# Patient Record
Sex: Male | Born: 1980
Health system: Southern US, Community
[De-identification: ages and names within clinical notes are randomized; demographics above are authoritative.]

## PROBLEM LIST (undated history)

## (undated) DIAGNOSIS — I10 Essential (primary) hypertension: Secondary | ICD-10-CM

## (undated) DIAGNOSIS — J189 Pneumonia, unspecified organism: Secondary | ICD-10-CM

## (undated) DIAGNOSIS — K5792 Diverticulitis of intestine, part unspecified, without perforation or abscess without bleeding: Secondary | ICD-10-CM

## (undated) DIAGNOSIS — J302 Other seasonal allergic rhinitis: Secondary | ICD-10-CM

## (undated) DIAGNOSIS — F952 Tourette's disorder: Secondary | ICD-10-CM

## (undated) HISTORY — PX: WISDOM TOOTH EXTRACTION: SHX21

## (undated) HISTORY — PX: TONSILLECTOMY: SUR1361

## (undated) HISTORY — DX: Tourette's disorder: F95.2

---

## 2003-05-08 ENCOUNTER — Encounter: Payer: Self-pay | Admitting: Emergency Medicine

## 2003-05-08 ENCOUNTER — Emergency Department (HOSPITAL_COMMUNITY): Admission: AC | Admit: 2003-05-08 | Discharge: 2003-05-08 | Payer: Self-pay

## 2003-05-12 ENCOUNTER — Emergency Department (HOSPITAL_COMMUNITY): Admission: EM | Admit: 2003-05-12 | Discharge: 2003-05-12 | Payer: Self-pay

## 2005-05-18 ENCOUNTER — Emergency Department (HOSPITAL_COMMUNITY): Admission: EM | Admit: 2005-05-18 | Discharge: 2005-05-19 | Payer: Self-pay | Admitting: Emergency Medicine

## 2008-01-31 ENCOUNTER — Emergency Department (HOSPITAL_COMMUNITY): Admission: EM | Admit: 2008-01-31 | Discharge: 2008-01-31 | Payer: Self-pay | Admitting: Emergency Medicine

## 2009-12-26 ENCOUNTER — Emergency Department (HOSPITAL_COMMUNITY): Admission: EM | Admit: 2009-12-26 | Discharge: 2009-12-26 | Payer: Self-pay | Admitting: Emergency Medicine

## 2010-10-04 ENCOUNTER — Emergency Department (HOSPITAL_COMMUNITY): Admission: EM | Admit: 2010-10-04 | Discharge: 2010-10-04 | Payer: Self-pay | Admitting: Emergency Medicine

## 2012-01-08 ENCOUNTER — Other Ambulatory Visit: Payer: Self-pay | Admitting: Family Medicine

## 2012-01-21 ENCOUNTER — Other Ambulatory Visit: Payer: Self-pay

## 2012-01-22 ENCOUNTER — Ambulatory Visit
Admission: RE | Admit: 2012-01-22 | Discharge: 2012-01-22 | Disposition: A | Discharge status: Home / Self Care | Payer: 59 | Source: Ambulatory Visit | Attending: Family Medicine | Admitting: Family Medicine

## 2012-01-22 MED ORDER — IOHEXOL 300 MG/ML  SOLN
125.0000 mL | Freq: Once | INTRAMUSCULAR | Status: AC | PRN
  Administered 2012-01-22: 125 mL via INTRAVENOUS

## 2012-06-04 ENCOUNTER — Encounter (HOSPITAL_COMMUNITY): Payer: Self-pay | Admitting: *Deleted

## 2012-06-04 ENCOUNTER — Emergency Department (HOSPITAL_COMMUNITY)
Admission: EM | Admit: 2012-06-04 | Discharge: 2012-06-04 | Disposition: A | Discharge status: Home / Self Care | Payer: Worker's Compensation | Attending: Emergency Medicine | Admitting: Emergency Medicine

## 2012-06-04 DIAGNOSIS — Y990 Civilian activity done for income or pay: Secondary | ICD-10-CM | POA: Insufficient documentation

## 2012-06-04 DIAGNOSIS — F17200 Nicotine dependence, unspecified, uncomplicated: Secondary | ICD-10-CM | POA: Insufficient documentation

## 2012-06-04 DIAGNOSIS — Y9289 Other specified places as the place of occurrence of the external cause: Secondary | ICD-10-CM | POA: Insufficient documentation

## 2012-06-04 DIAGNOSIS — IMO0002 Reserved for concepts with insufficient information to code with codable children: Secondary | ICD-10-CM

## 2012-06-04 DIAGNOSIS — W268XXA Contact with other sharp object(s), not elsewhere classified, initial encounter: Secondary | ICD-10-CM | POA: Insufficient documentation

## 2012-06-04 DIAGNOSIS — S61209A Unspecified open wound of unspecified finger without damage to nail, initial encounter: Secondary | ICD-10-CM | POA: Insufficient documentation

## 2012-06-04 NOTE — Discharge Instructions (Signed)

## 2012-06-04 NOTE — ED Notes (Signed)
Pt presents w/ finger lacerations x 2 to anterior 3rd and 4 th digits on R hand. Bleeding controlled w/ bandage applied by RN prior to triage. Pt in no acute distress, lacerations sustained at work.

## 2012-06-04 NOTE — ED Provider Notes (Signed)
History     CSN: [id number]  Arrival date & time 06/04/12  2036   First MD Initiated Contact with Patient 06/04/12 2140      10:04 PM HPI Patient reports he was at work when his second and third right fingers were caught in a metal edge. States has small lacerations to his fingers. Reports he was advised to come to emergency department or work. Reorts last tetanus was 2 years ago. Patient is a 31 y.o. male presenting with skin laceration. The history is provided by the patient.  Laceration  The incident occurred 1 to 2 hours ago. The laceration is located on the right hand. The laceration is 1 cm in size. The laceration mechanism was a a metal edge. The pain is mild. The pain has been constant since onset. He reports no foreign bodies present. His tetanus status is UTD.    History reviewed. No pertinent past medical history.  History reviewed. No pertinent past surgical history.  History reviewed. No pertinent family history.  History  Substance Use Topics  . Smoking status: Current Everyday Smoker -- 1.0 packs/day    Types: Cigarettes  . Smokeless tobacco: Not on file  . Alcohol Use: 3.6 oz/week    6 Cans of beer per week     x 2 days a week      Review of Systems  Constitutional: Negative for fever.  Skin: Positive for wound (laceration). Negative for color change.  All other systems reviewed and are negative.    Allergies  Orap  Home Medications  No current outpatient prescriptions on file.  BP 153/90  Pulse 98  Temp 98.4 F (36.9 C) (Oral)  Resp 22  SpO2 98%  Physical Exam  Vitals reviewed. Constitutional: He is oriented to person, place, and time. He appears well-developed and well-nourished.  HENT:  Head: Normocephalic and atraumatic.  Eyes: Pupils are equal, round, and reactive to light.  Neurological: He is alert and oriented to person, place, and time.  Skin: Skin is warm and dry. No rash noted. No erythema. No pallor.       Small superficial  laceration of right third and fourth dorsal digits. Lacerations just proximal to PIP joints. Hemostasis achieved. No wound repair needed since wounds are extremely superficial  Psychiatric: He has a normal mood and affect. His behavior is normal.    ED Course  Procedures   MDM   Wounds have been cleaned with betadine and covered with bacitracin and betadine no wound closure needed. Advised patient to return for worsening signs such as erythematous streaking or failure of wound seal. Patient voices understanding and is ready for discharge    Thomasene Lot, PA-C 06/04/12 2304

## 2012-06-05 NOTE — ED Provider Notes (Signed)
Medical screening examination/treatment/procedure(s) were performed by non-physician practitioner and as supervising physician I was immediately available for consultation/collaboration.   Makai Agostinelli M Markes Shatswell, MD 06/05/12 0039 

## 2013-01-03 ENCOUNTER — Ambulatory Visit
Admission: RE | Admit: 2013-01-03 | Discharge: 2013-01-03 | Disposition: A | Discharge status: Home / Self Care | Payer: 59 | Source: Ambulatory Visit | Attending: Family Medicine | Admitting: Family Medicine

## 2013-01-03 ENCOUNTER — Other Ambulatory Visit: Payer: Self-pay | Admitting: Family Medicine

## 2013-01-03 DIAGNOSIS — M549 Dorsalgia, unspecified: Secondary | ICD-10-CM

## 2013-01-11 ENCOUNTER — Other Ambulatory Visit: Payer: Self-pay | Admitting: Gastroenterology

## 2013-01-11 DIAGNOSIS — R109 Unspecified abdominal pain: Secondary | ICD-10-CM

## 2013-01-13 ENCOUNTER — Ambulatory Visit
Admission: RE | Admit: 2013-01-13 | Discharge: 2013-01-13 | Disposition: A | Discharge status: Home / Self Care | Payer: 59 | Source: Ambulatory Visit | Attending: Gastroenterology | Admitting: Gastroenterology

## 2013-01-13 DIAGNOSIS — R109 Unspecified abdominal pain: Secondary | ICD-10-CM

## 2013-01-13 MED ORDER — IOHEXOL 300 MG/ML  SOLN
150.0000 mL | Freq: Once | INTRAMUSCULAR | Status: AC | PRN
  Administered 2013-01-13: 150 mL via INTRAVENOUS

## 2013-01-17 ENCOUNTER — Emergency Department (HOSPITAL_COMMUNITY): Payer: 59

## 2013-01-17 ENCOUNTER — Emergency Department (HOSPITAL_COMMUNITY)
Admission: EM | Admit: 2013-01-17 | Discharge: 2013-01-17 | Disposition: A | Discharge status: Home / Self Care | Payer: 59 | Attending: Emergency Medicine | Admitting: Emergency Medicine

## 2013-01-17 ENCOUNTER — Encounter (HOSPITAL_COMMUNITY): Payer: Self-pay | Admitting: *Deleted

## 2013-01-17 DIAGNOSIS — R197 Diarrhea, unspecified: Secondary | ICD-10-CM | POA: Insufficient documentation

## 2013-01-17 DIAGNOSIS — R109 Unspecified abdominal pain: Secondary | ICD-10-CM

## 2013-01-17 DIAGNOSIS — Z8719 Personal history of other diseases of the digestive system: Secondary | ICD-10-CM | POA: Insufficient documentation

## 2013-01-17 DIAGNOSIS — R110 Nausea: Secondary | ICD-10-CM | POA: Insufficient documentation

## 2013-01-17 DIAGNOSIS — F17200 Nicotine dependence, unspecified, uncomplicated: Secondary | ICD-10-CM | POA: Insufficient documentation

## 2013-01-17 HISTORY — DX: Diverticulitis of intestine, part unspecified, without perforation or abscess without bleeding: K57.92

## 2013-01-17 LAB — URINALYSIS, ROUTINE W REFLEX MICROSCOPIC
Bilirubin Urine: NEGATIVE
Glucose, UA: NEGATIVE mg/dL
Ketones, ur: 15 mg/dL — AB
Specific Gravity, Urine: 1.028 (ref 1.005–1.030)
pH: 5.5 (ref 5.0–8.0)

## 2013-01-17 LAB — COMPREHENSIVE METABOLIC PANEL
ALT: 58 U/L — ABNORMAL HIGH (ref 0–53)
AST: 41 U/L — ABNORMAL HIGH (ref 0–37)
Albumin: 3.9 g/dL (ref 3.5–5.2)
Alkaline Phosphatase: 47 U/L (ref 39–117)
Chloride: 102 mEq/L (ref 96–112)
Potassium: 4.1 mEq/L (ref 3.5–5.1)
Sodium: 141 mEq/L (ref 135–145)
Total Bilirubin: 0.2 mg/dL — ABNORMAL LOW (ref 0.3–1.2)

## 2013-01-17 LAB — CBC WITH DIFFERENTIAL/PLATELET
Basophils Absolute: 0 10*3/uL (ref 0.0–0.1)
Basophils Relative: 0 % (ref 0–1)
Hemoglobin: 17.1 g/dL — ABNORMAL HIGH (ref 13.0–17.0)
MCHC: 33.5 g/dL (ref 30.0–36.0)
Monocytes Relative: 9 % (ref 3–12)
Neutro Abs: 6.7 10*3/uL (ref 1.7–7.7)
Neutrophils Relative %: 56 % (ref 43–77)
Platelets: 323 10*3/uL (ref 150–400)
RDW: 13.1 % (ref 11.5–15.5)

## 2013-01-17 MED ORDER — HYDROMORPHONE HCL PF 1 MG/ML IJ SOLN
1.0000 mg | Freq: Once | INTRAMUSCULAR | Status: AC
Start: 2013-01-17 — End: 2013-01-17
  Administered 2013-01-17: 1 mg via INTRAVENOUS
  Filled 2013-01-17: qty 1

## 2013-01-17 MED ORDER — OXYCODONE-ACETAMINOPHEN 5-325 MG PO TABS: 1.0000 | ORAL_TABLET | Freq: Four times a day (QID) | ORAL | Status: DC | PRN

## 2013-01-17 NOTE — ED Provider Notes (Signed)
History     CSN: [id number]  Arrival date & time 01/17/13  1253   First MD Initiated Contact with Patient 01/17/13 1449      Chief Complaint  Patient presents with  . Abdominal Pain    (Consider location/radiation/quality/duration/timing/severity/associated sxs/prior treatment) Patient is a 32 y.o. male presenting with abdominal pain. The history is provided by the patient. No language interpreter was used.  Abdominal Pain The primary symptoms of the illness include abdominal pain, nausea and diarrhea. The primary symptoms of the illness do not include vomiting. The current episode started more than 2 days ago. The onset of the illness was gradual. The problem has been gradually worsening.  The severity of the abdominal pain is 3/10.  The illness is associated with recent antibiotic use. The patient has not had a change in bowel habit. Significant associated medical issues include diverticulitis.  Patient recently seen for exacerbation of diverticulitis, completed antibiotics with improvement in symptoms, confirmed by CT findings.  Patient reports awakening this morning with increase in pain, running across umbilical.region to both sides of abdomen.  Past Medical History  Diagnosis Date  . Diverticulitis     Past Surgical History  Procedure Date  . Tonsillectomy     No family history on file.  History  Substance Use Topics  . Smoking status: Current Every Day Smoker -- 0.5 packs/day    Types: Cigarettes  . Smokeless tobacco: Not on file  . Alcohol Use: No     Comment: x 2 days a week      Review of Systems  Gastrointestinal: Positive for nausea, abdominal pain and diarrhea. Negative for vomiting.  All other systems reviewed and are negative.    Allergies  Orap  Home Medications   Current Outpatient Rx  Name  Route  Sig  Dispense  Refill  . HYDROCORTISONE 1 % EX CREA   Topical   Apply 1 application topically 2 (two) times daily. For hands           BP  156/94  Pulse 94  Temp 98.3 F (36.8 C) (Oral)  Resp 16  SpO2 97%  Physical Exam  Nursing note and vitals reviewed. Constitutional: He is oriented to person, place, and time. He appears well-developed.  HENT:  Head: Normocephalic.  Eyes: Pupils are equal, round, and reactive to light.  Neck: Normal range of motion. Neck supple.  Cardiovascular: Normal rate and regular rhythm.   Pulmonary/Chest: Effort normal and breath sounds normal.  Abdominal: Soft. He exhibits distension. There is tenderness. There is no guarding and no CVA tenderness.    Musculoskeletal: Normal range of motion.  Lymphadenopathy:    He has no cervical adenopathy.  Neurological: He is alert and oriented to person, place, and time.  Skin: Skin is warm and dry.  Psychiatric: He has a normal mood and affect. His behavior is normal. Judgment and thought content normal.    ED Course  Procedures (including critical care time)  Labs Reviewed  CBC WITH DIFFERENTIAL - Abnormal; Notable for the following:    WBC 12.0 (*)     RBC 5.82 (*)     Hemoglobin 17.1 (*)     Monocytes Absolute 1.1 (*)     All other components within normal limits  COMPREHENSIVE METABOLIC PANEL - Abnormal; Notable for the following:    AST 41 (*)     ALT 58 (*)     Total Bilirubin 0.2 (*)     All other components within normal limits  URINALYSIS, ROUTINE W REFLEX MICROSCOPIC - Abnormal; Notable for the following:    Ketones, ur 15 (*)     All other components within normal limits  LIPASE, BLOOD   No results found.   No diagnosis found.  Patient has been seen by Madilyn Fireman with GI.  Patient's primary care provider has also contacted [facility] for a second opinion.  Patient is waiting for the office to contact him with the appointment time.  Discussed with Dr. Patria Mane.  Will treat pain, obtain plain films.  If no abnormal findings, home with pain medication and recheck tomorrow if pain persists.  MDM          Jimmye Norman,  NP 01/17/13 2225

## 2013-01-17 NOTE — ED Notes (Signed)
MD Campos at bedside.  

## 2013-01-17 NOTE — ED Notes (Signed)
NP David at bedside 

## 2013-01-17 NOTE — ED Notes (Signed)
PT with hx of diverticulitis to ED by Koirala (his pcp) for continued abd pain.  Pt was tx for flare-up 2 weeks prior and has completed all antibiotics.  His abd CT on 1/30 showed resolving diverticulitis.  However, when pt awoke today he began experiencing lower abd pain again.

## 2013-01-19 NOTE — ED Provider Notes (Signed)
Medical screening examination/treatment/procedure(s) were performed by non-physician practitioner and as supervising physician I was immediately available for consultation/collaboration.   Lyanne Co, MD 01/19/13 [phone]

## 2013-04-01 ENCOUNTER — Emergency Department (HOSPITAL_COMMUNITY)
Admission: EM | Admit: 2013-04-01 | Discharge: 2013-04-01 | Disposition: A | Discharge status: Home / Self Care | Payer: 59 | Attending: Emergency Medicine | Admitting: Emergency Medicine

## 2013-04-01 ENCOUNTER — Encounter (HOSPITAL_COMMUNITY): Payer: Self-pay | Admitting: Emergency Medicine

## 2013-04-01 ENCOUNTER — Emergency Department (HOSPITAL_COMMUNITY): Payer: 59

## 2013-04-01 DIAGNOSIS — K5732 Diverticulitis of large intestine without perforation or abscess without bleeding: Secondary | ICD-10-CM | POA: Insufficient documentation

## 2013-04-01 DIAGNOSIS — I10 Essential (primary) hypertension: Secondary | ICD-10-CM | POA: Insufficient documentation

## 2013-04-01 DIAGNOSIS — F17200 Nicotine dependence, unspecified, uncomplicated: Secondary | ICD-10-CM | POA: Insufficient documentation

## 2013-04-01 DIAGNOSIS — Z79899 Other long term (current) drug therapy: Secondary | ICD-10-CM | POA: Insufficient documentation

## 2013-04-01 DIAGNOSIS — K5792 Diverticulitis of intestine, part unspecified, without perforation or abscess without bleeding: Secondary | ICD-10-CM

## 2013-04-01 HISTORY — DX: Essential (primary) hypertension: I10

## 2013-04-01 LAB — CBC WITH DIFFERENTIAL/PLATELET
Basophils Absolute: 0 10*3/uL (ref 0.0–0.1)
Basophils Relative: 0 % (ref 0–1)
Eosinophils Absolute: 0.5 10*3/uL (ref 0.0–0.7)
HCT: 49.3 % (ref 39.0–52.0)
Hemoglobin: 17.8 g/dL — ABNORMAL HIGH (ref 13.0–17.0)
Lymphocytes Relative: 25 % (ref 12–46)
Lymphs Abs: 3.3 10*3/uL (ref 0.7–4.0)
MCH: 30.8 pg (ref 26.0–34.0)
MCHC: 36.1 g/dL — ABNORMAL HIGH (ref 30.0–36.0)
MCV: 85.3 fL (ref 78.0–100.0)
Monocytes Absolute: 1.2 10*3/uL — ABNORMAL HIGH (ref 0.1–1.0)
Monocytes Relative: 9 % (ref 3–12)
Neutro Abs: 8.5 10*3/uL — ABNORMAL HIGH (ref 1.7–7.7)
Neutrophils Relative %: 63 % (ref 43–77)
RBC: 5.78 MIL/uL (ref 4.22–5.81)
WBC: 13.5 10*3/uL — ABNORMAL HIGH (ref 4.0–10.5)

## 2013-04-01 LAB — COMPREHENSIVE METABOLIC PANEL
ALT: 50 U/L (ref 0–53)
Albumin: 3.8 g/dL (ref 3.5–5.2)
Alkaline Phosphatase: 48 U/L (ref 39–117)
BUN: 15 mg/dL (ref 6–23)
CO2: 30 mEq/L (ref 19–32)
Calcium: 9.7 mg/dL (ref 8.4–10.5)
Chloride: 100 mEq/L (ref 96–112)
Creatinine, Ser: 1.01 mg/dL (ref 0.50–1.35)
GFR calc Af Amer: >90 mL/min (ref >90)
GFR calc non Af Amer: >90 mL/min (ref >90)
Glucose, Bld: 95 mg/dL (ref 70–99)
Potassium: 4.6 mEq/L (ref 3.5–5.1)
Sodium: 137 mEq/L (ref 135–145)
Total Bilirubin: 0.4 mg/dL (ref 0.3–1.2)

## 2013-04-01 MED ORDER — FENTANYL CITRATE 0.05 MG/ML IJ SOLN
100.0000 ug | Freq: Once | INTRAMUSCULAR | Status: AC
  Administered 2013-04-01: 100 ug via INTRAVENOUS
  Filled 2013-04-01: qty 2

## 2013-04-01 MED ORDER — ONDANSETRON HCL 4 MG/2ML IJ SOLN
4.0000 mg | Freq: Once | INTRAMUSCULAR | Status: AC
  Administered 2013-04-01: 4 mg via INTRAVENOUS
  Filled 2013-04-01: qty 2

## 2013-04-01 MED ORDER — IOHEXOL 300 MG/ML  SOLN
100.0000 mL | Freq: Once | INTRAMUSCULAR | Status: AC | PRN
  Administered 2013-04-01: 100 mL via INTRAVENOUS

## 2013-04-01 MED ORDER — OXYCODONE-ACETAMINOPHEN 5-325 MG PO TABS: 2.0000 | ORAL_TABLET | ORAL | Status: DC | PRN

## 2013-04-01 MED ORDER — AMOXICILLIN-POT CLAVULANATE 500-125 MG PO TABS: 1.0000 | ORAL_TABLET | Freq: Three times a day (TID) | ORAL | Status: DC

## 2013-04-01 MED ORDER — SODIUM CHLORIDE 0.9 % IV SOLN
3.0000 g | Freq: Once | INTRAVENOUS | Status: AC
  Administered 2013-04-01: 3 g via INTRAVENOUS
  Filled 2013-04-01: qty 3

## 2013-04-01 MED ORDER — IOHEXOL 300 MG/ML  SOLN
25.0000 mL | INTRAMUSCULAR | Status: AC
  Administered 2013-04-01 (×2): 25 mL via ORAL

## 2013-04-01 NOTE — ED Notes (Signed)
Pt states that he is having a flare-up of his diverticulitis that started yesterday morning. The pt reports that he has a appointment with is doctor on Monday, but was advised to come to the ER today.

## 2013-04-01 NOTE — ED Notes (Signed)
Pt alert and mentating appropriately. NAD noted at this time. Pt denies pain. Pt denies n/v/d.

## 2013-04-01 NOTE — ED Provider Notes (Signed)
History     CSN: [id number]  Arrival date & time 04/01/13  7829   First MD Initiated Contact with Patient 04/01/13 1004      Chief Complaint  Patient presents with  . Diverticulitis     HPI Pt states that he is having a flare-up of his diverticulitis that started yesterday morning. The pt reports that he has a appointment with is doctor on Monday, but was advised to come to the ER today.  Past Medical History  Diagnosis Date  . Diverticulitis   . Hypertension     Past Surgical History  Procedure Laterality Date  . Tonsillectomy      History reviewed. No pertinent family history.  History  Substance Use Topics  . Smoking status: Current Every Day Smoker -- 1.00 packs/day for 15 years    Types: Cigarettes  . Smokeless tobacco: Never Used  . Alcohol Use: No     Comment: x 2 days a week      Review of Systems All other systems reviewed and are negative Allergies  Orap and Mushroom extract complex  Home Medications   Current Outpatient Rx  Name  Route  Sig  Dispense  Refill  . hydrochlorothiazide (HYDRODIURIL) 12.5 MG tablet   Oral   Take 12.5 mg by mouth daily.         . hydrocortisone cream 1 %   Topical   Apply 1 application topically 2 (two) times daily as needed (for hands).          Marland Kitchen amoxicillin-clavulanate (AUGMENTIN) 500-125 MG per tablet   Oral   Take 1 tablet (500 mg total) by mouth every 8 (eight) hours.   21 tablet   0   . oxyCODONE-acetaminophen (PERCOCET/ROXICET) 5-325 MG per tablet   Oral   Take 2 tablets by mouth every 4 (four) hours as needed for pain.   20 tablet   0     BP 142/88  Pulse 79  Temp(Src) 98.1 F (36.7 C) (Oral)  Resp 20  Ht 5\' 11"  (1.803 m)  Wt 307 lb (139.254 kg)  BMI 42.84 kg/m2  SpO2 99%  Physical Exam  Nursing note and vitals reviewed. Constitutional: He is oriented to person, place, and time. He appears well-developed and well-nourished. No distress.  HENT:  Head: Normocephalic and atraumatic.    Eyes: Pupils are equal, round, and reactive to light.  Neck: Normal range of motion.  Cardiovascular: Normal rate and intact distal pulses.   Pulmonary/Chest: No respiratory distress.  Abdominal: Normal appearance. He exhibits no distension. There is tenderness. There is no rigidity, no rebound and no guarding.    Musculoskeletal: Normal range of motion.  Neurological: He is alert and oriented to person, place, and time. No cranial nerve deficit.  Skin: Skin is warm and dry. No rash noted.  Psychiatric: He has a normal mood and affect. His behavior is normal.    ED Course  Procedures (including critical care time)  Labs Reviewed  CBC WITH DIFFERENTIAL - Abnormal; Notable for the following:    WBC 13.5 (*)    Hemoglobin 17.8 (*)    MCHC 36.1 (*)    Neutro Abs 8.5 (*)    Monocytes Absolute 1.2 (*)    All other components within normal limits  COMPREHENSIVE METABOLIC PANEL   CT abdomen pelvis with contrast  04/01/2013  *RADIOLOGY REPORT*    IMPRESSION:  1.  Sigmoid colon diverticula are noted.  There is mild stranding of the pericolonic fat proximal  sigmoid colon in the left lower quadrant.  Findings are consistent with mild diverticulitis.  No pericolonic abscess.  2.  Thickening of proximal sigmoid colon wall probable due to chronic diverticulosis.  3.  Stool noted within cecum.  No pericecal inflammation.  Normal appendix is clearly visualized.  4.  Fatty infiltration of the liver.  5.  Distended urinary bladder.    Original Report Authenticated By: Natasha Mead, M.D.      1. Diverticulitis       MDM          Nelia Shi, MD 04/01/13 [phone]

## 2013-04-01 NOTE — ED Notes (Signed)
Pt alert and mentating appropriately. Pt given d/c teaching and prescriptions. Pt verbalizes understanding and has no further questions upon d/c. Pt ambulatory leaving ED. Pt instructed not to drive and endorses wife will be driving home. NAD noted upon d/c.

## 2013-07-08 ENCOUNTER — Encounter (INDEPENDENT_AMBULATORY_CARE_PROVIDER_SITE_OTHER): Payer: Self-pay

## 2013-07-11 ENCOUNTER — Encounter (INDEPENDENT_AMBULATORY_CARE_PROVIDER_SITE_OTHER): Payer: Self-pay | Admitting: General Surgery

## 2013-07-11 ENCOUNTER — Ambulatory Visit (INDEPENDENT_AMBULATORY_CARE_PROVIDER_SITE_OTHER): Payer: 59 | Admitting: General Surgery

## 2013-07-11 VITALS — BP 182/94 | HR 84 | Temp 98.1°F | Resp 20 | Ht 71.0 in | Wt 324.0 lb

## 2013-07-11 DIAGNOSIS — Z01818 Encounter for other preprocedural examination: Secondary | ICD-10-CM

## 2013-07-11 DIAGNOSIS — K5732 Diverticulitis of large intestine without perforation or abscess without bleeding: Secondary | ICD-10-CM

## 2013-07-11 MED ORDER — METRONIDAZOLE 500 MG PO TABS: 500.0000 mg | ORAL_TABLET | ORAL | Status: DC

## 2013-07-11 NOTE — Progress Notes (Signed)
Chief Complaint  Patient presents with  . New Evaluation    eval for left colon resection    HISTORY: Christopher Williamson is a 31 y.o. male who presents to the office with abdominal pain.  He has had recurrent abd pain about every 3 months.  He has had 2 CT documented cases of mild diverticulitis without complications.   His symptoms start with abd discomfort and fatigue.  Then he develops pain in his back that radiates to his abd.  His worst pain is in his LLQ.  This had been occurring for 5 yrs.  He has tried antibiotics in the past, which help, but he has to take narcotics for several days to get through the worst of his symptoms.  Nothing he does or eats makes the symptoms worse.  His bowel habits are regular and his bowel movements are hard, but he denies straining.  His fiber intake is mostly through metamucil.  His last colonoscopy was in May and showed diverticulosis and rectosigmoid inflammation.     Past Medical History  Diagnosis Date  . Diverticulitis   . Hypertension   . Tourette's syndrome       Past Surgical History  Procedure Laterality Date  . Tonsillectomy    . Wisdom tooth extraction          Current Outpatient Prescriptions  Medication Sig Dispense Refill  . hydrochlorothiazide (HYDRODIURIL) 25 MG tablet Take 25 mg by mouth daily.      . metroNIDAZOLE (FLAGYL) 500 MG tablet Take 1 tablet (500 mg total) by mouth as directed.  3 tablet  0   No current facility-administered medications for this visit.      Allergies  Allergen Reactions  . Orap (Pimozide) Anaphylaxis  . Mushroom Extract Complex Other (See Comments)    Mushrooms cause gi upset      History reviewed. No pertinent family history.  History   Social History  . Marital Status: Married    Spouse Name: N/A    Number of Children: N/A  . Years of Education: N/A   Social History Main Topics  . Smoking status: Former Smoker -- 1.00 packs/day for 15 years    Types: Cigarettes    Quit date: 05/17/2013  .  Smokeless tobacco: Never Used  . Alcohol Use: 3.6 oz/week    6 Cans of beer per week     Comment: x 2 days a week  . Drug Use: No  . Sexually Active: Yes    Birth Control/ Protection: None   Other Topics Concern  . None   Social History Narrative  . None      REVIEW OF SYSTEMS - PERTINENT POSITIVES ONLY: Review of Systems - General ROS: negative for - chills, fever or weight loss Hematological and Lymphatic ROS: negative for - bleeding problems, blood clots or bruising Respiratory ROS: no cough, shortness of breath, or wheezing Cardiovascular ROS: no chest pain or dyspnea on exertion Gastrointestinal ROS: positive for - abdominal pain negative for - blood in stools, change in bowel habits, change in stools, constipation or nausea/vomiting Genito-Urinary ROS: no dysuria, trouble voiding, or hematuria  EXAM: Filed Vitals:   07/11/13 0907  BP: 182/94  Pulse: 84  Temp: 98.1 F (36.7 C)  Resp: 20    General appearance: alert and cooperative Resp: clear to auscultation bilaterally Cardio: regular rate and rhythm GI: normal findings: no masses palpable and soft and abnormal findings:  mild tenderness in the suprapubic region and moderate point tenderness in   the LLQ Extremities: extremities normal, atraumatic, no cyanosis or edema    ASSESSMENT AND PLAN Christopher Williamson is a 31 y.o. M with recurrent diverticulitis.  He has had at least 2 CT documented episodes and several other episodes that all responded to antibiotics.  On exam he has point tenderness in his LLQ.  He has tried dietary modifications and fiber supplements with no change in symptoms.  We discussed his options in great detail.  After presenting all of this information, I had a discussion with he and his wife about surgery.  I told them that I felt that this would most likely greatly help his symptoms, but that I could not guarantee this would resolve all of his problems, due to the fact that his pain did not seem to  match CT and lab findings.  I believe he understands this completely and has agreed to proceed with surgical resection.  I will attempt to perform this laparoscopically, if possible.    The surgery and anatomy were described to the patient as well as the risks of surgery and the possible complications.  These include: Bleeding, infection and possible wound complications such as hernia, damage to adjacent structures, leak of surgical connections, which can lead to other surgeries and possibly an ostomy (5-7%), possible need for other procedures, such as abscess drains in radiology, possible prolonged hospital stay, possible diarrhea from removal of part of the colon, possible constipation from narcotics, prolonged fatigue/weakness or appetite loss, possible complications of their medical problems such as heart disease or arrhythmias or lung problems, death (less than 1%). I believe the patient understands and wishes to proceed with the surgery.     Evonna Stoltz C Robb Sibal, MD Colon and Rectal Surgery / General Surgery [facility]      Visit Diagnoses: 1. Diverticulitis of colon without hemorrhage   2. Preop examination     Primary Care Physician: KOIRALA,DIBAS, MD  

## 2013-07-11 NOTE — Patient Instructions (Addendum)
Diverticular Disease  Diverticulosis of the colon is a common condition that afflicts about 50 percent of Americans by age 30 and nearly all by age 37. Only a small percentage of those with diverticulosis have symptoms, and even fewer will ever require surgery.    What is Diverticulosis/ Diverticulitis?  Diverticula are pockets that develop in the colon wall, usually in the sigmoid or left colon, but may involve the entire colon. Diverticulosis describes the presence of these pockets. Diverticulitis describes inflammation or complications of these pockets.  What are the symptoms of diverticular disease?  Uncomplicated diverticular disease is usually not associated with symptoms. Symptoms are related to complications of diverticular disease including diverticulits and bleeding. Diverticular disease is a common cause of significant bleeding from the colon.  Diverticulitis - an infection of the diverticula - may cause one or more of the following symptoms: pain in the abdomen, chills, fever and change in bowel habits. More intense symptoms are associated with serious complications such as perforation (rupture), abscess or fistula formation (an abnormal connection between the colon and another organ or the skin). What is the cause of diverticular disease?  The cause of diverticulosis and diverticulitis is not precisely known, but it is more common for people with a low fiber diet. It is thought that a low-fiber diet over the years creates increased colon pressure and results in pockets or diverticula.  How is diverticular disease treated?  Increasing the amount of dietary fiber (grains, legumes, vegetables, etc.) - and sometimes restricting certain foods reduces the pressure in the colon and may decrease the risk of complications due to diverticular disease.  Diverticulitis requires different management. Mild cases may be managed with oral antibiotics, dietary restrictions and possibly stool softeners.  More severe cases require hospitalization with intravenous antibiotics and dietary restraints. Most acute attacks can be relieved with such methods.  When is surgery necessary?  Surgery is reserved for patients with recurrent episodes of diverticulitis, complications or severe attacks when there's little or no response to medication. Surgery may also be required in individuals with a single episode of severe bleeding from diverticulosis or with recurrent episodes of bleeding.  Surgical treatment for diverticulitis removes the diseased part of the colon, most commonly, the left or sigmoid colon. Often the colon is hooked up or "anastomosed" again to the rectum. Complete recovery can be expected. Normal bowel function usually resumes in about three weeks. In emergency surgeries, patients may require a temporary colostomy bag. Patients are encouraged to seek medical attention for abdominal symptoms early to help avoid complications.  What is a colon and rectal surgeon? Colon and rectal surgeons are experts in the surgical and non-surgical treatment of diseases of the colon, rectum and anus. They have completed advanced surgical training in the treatment of these diseases as well as full general surgical training. Board-certified colon and rectal surgeons complete residencies in general surgery and colon and rectal surgery, and pass intensive examinations conducted by the American Board of Surgery and the American Board of Colon and Rectal Surgery. They are well-versed in the treatment of both benign and malignant diseases of the colon, rectum and anus and are able to perform routine screening examinations and surgically treat conditions if indicated to do so.  2012 American Society of Colon & Rectal Surgeons       [facility]  ONE-DAY (1) PRE-OP HOME COLON PREP INSTRUCTIONS: ** MIRALAX / GATORADE PREP / FLAGYL**  You must follow the instructions below carefully.  If you have  questions or  problems, please call and speak to someone in the clinic department at our office:   [phone].     INSTRUCTIONS: 1. Five days prior to your procedure do not eat nuts, popcorn, or fruit with seeds.  Stop all fiber supplements such as Metamucil, Citrucel, etc. 2. Two days before surgery fill the prescription at a pharmacy of your choice and purchase the additional supplies below.         MIRALAX - GATORADE -- DULCOLAX TABS -- FLEET ENEMA:   Purchase a bottle of MIRALAX  (255 gm bottle)    In addition, purchase four (4) DULCOLAX TABLETS (no prescription required- ask the pharmacist if you can't find them)   Purchase one Fleet Enema (Green and white box)    Purchase one 64 oz GATORADE.  (Do NOT purchase red Gatorade; any other flavor is acceptable) and place in refrigerator to get cold.  3.   Day Before Surgery:   6 am: Wash you abdomen with soap and repeat this on the morning of surgery and take 4 Dulcolax tablets   You may only have clear liquids (tea, coffee, juice, broth, jello, soft drinks, gummy bears).  You cannot have solid foods, cream, milk or milk products.  Drink at lease 8 ounces of liquids every hour while awake.   Take the Flagyl prescription as directed at 8 am, 2 pm and 8 pm.  It is helpful to take this with some jello instead of on an empty stomach.  Any flavor is ok, except red jello, which will cause red stools.   Mix the entire bottle of MiraLax and the Gatorade in a large container.    10:00am: Begin drinking the Gatorade mixture until gone (8 oz every 15-30 minutes).      You may suck on a lime wedge or hard candy to "freshen your palate" in between glasses   If you are a diabetic, take your blood sugar reading several time throughout the prep.  Have some juice available to take if your sugar level gets too low   You may feel chilled while taking the prep.  Have some warm tea or broth to help warm up.   Continue clear liquids until midnight or bedtime  3. The day of your  procedure:   Do not eat or drink ANYTHING after midnight before your surgery.     If you take Heart or Blood Pressure medicine, ask the pre-op nurses about these during your preop appointment.   Take a regular Fleet Enema one hour before leaving the come to the hospital.  Try to hold it in 5-10 mins before expelling.   Further pre-operative instructions will be given to you from the hospital.   Expect to be contacted 5-7 days before your surgery.

## 2013-07-18 ENCOUNTER — Encounter (HOSPITAL_COMMUNITY): Payer: Self-pay | Admitting: Pharmacy Technician

## 2013-07-25 ENCOUNTER — Encounter (HOSPITAL_COMMUNITY)
Admission: RE | Admit: 2013-07-25 | Discharge: 2013-07-25 | Disposition: A | Discharge status: Home / Self Care | Payer: 59 | Source: Ambulatory Visit | Attending: General Surgery | Admitting: General Surgery

## 2013-07-25 ENCOUNTER — Encounter (HOSPITAL_COMMUNITY): Payer: Self-pay

## 2013-07-25 DIAGNOSIS — Z01810 Encounter for preprocedural cardiovascular examination: Secondary | ICD-10-CM | POA: Insufficient documentation

## 2013-07-25 DIAGNOSIS — Z01818 Encounter for other preprocedural examination: Secondary | ICD-10-CM | POA: Insufficient documentation

## 2013-07-25 DIAGNOSIS — Z01812 Encounter for preprocedural laboratory examination: Secondary | ICD-10-CM | POA: Insufficient documentation

## 2013-07-25 HISTORY — DX: Pneumonia, unspecified organism: J18.9

## 2013-07-25 HISTORY — DX: Other seasonal allergic rhinitis: J30.2

## 2013-07-25 LAB — CBC
MCH: 29.5 pg (ref 26.0–34.0)
Platelets: 319 10*3/uL (ref 150–400)
RBC: 5.32 MIL/uL (ref 4.22–5.81)
RDW: 13.1 % (ref 11.5–15.5)

## 2013-07-25 LAB — BASIC METABOLIC PANEL
CO2: 31 mEq/L (ref 19–32)
Calcium: 10 mg/dL (ref 8.4–10.5)
GFR calc Af Amer: >90 mL/min (ref >90)
GFR calc non Af Amer: >90 mL/min (ref >90)
Sodium: 137 mEq/L (ref 135–145)

## 2013-07-25 NOTE — Progress Notes (Signed)
07/25/13 1355  OBSTRUCTIVE SLEEP APNEA  Have you ever been diagnosed with sleep apnea through a sleep study? No  Do you snore loudly (loud enough to be heard through closed doors)?  1  Do you often feel tired, fatigued, or sleepy during the daytime? 0  Has anyone observed you stop breathing during your sleep? 0  Do you have, or are you being treated for high blood pressure? 1  BMI more than 35 kg/m2? 1  Age over 30 years old? 0  Neck circumference greater than 40 cm/18 inches? 0  Gender: 1  Obstructive Sleep Apnea Score 4  Score 4 or greater  Results sent to PCP

## 2013-07-25 NOTE — Patient Instructions (Addendum)
[address] Loden Laurent  07/25/2013   Your procedure is scheduled on: 08/01/13  Report to [facility] at 7:00 AM.  Call this number if you have problems the morning of surgery [phone]: [phone]   Remember: please follow bowel prep instructions    Do not eat food or drink liquids After Midnight.   Do not wear jewelry, make-up or nail polish.  Do not wear lotions, powders, or perfumes. You may wear deodorant.  Do not shave 48 hours prior to surgery. Men may shave face and neck.  Do not bring valuables to the hospital.  Contacts, dentures or bridgework may not be worn into surgery.  Leave suitcase in the car. After surgery it may be brought to your room.  For patients admitted to the hospital, checkout time is 11:00 AM the day of discharge.    Please read over the following fact sheets that you were given: blood fact sheet Birdie Sons, RN  pre op nurse call if needed [phone]    FAILURE TO FOLLOW THESE INSTRUCTIONS MAY RESULT IN CANCELLATION OF YOUR SURGERY   Patient Signature: ___________________________________________

## 2013-07-25 NOTE — Progress Notes (Signed)
CT abdomen 04/01/13 on EPIC

## 2013-07-31 NOTE — Anesthesia Preprocedure Evaluation (Addendum)
Anesthesia Evaluation  Patient identified by MRN, date of birth, ID band Patient awake    Reviewed: Allergy & Precautions, H&P , NPO status , Patient's Chart, lab work & pertinent test results  Airway Mallampati: II TM Distance: >3 FB Neck ROM: full    Dental no notable dental hx. (+) Teeth Intact and Dental Advisory Given   Pulmonary neg pulmonary ROS,  breath sounds clear to auscultation  Pulmonary exam normal       Cardiovascular Exercise Tolerance: Good hypertension, Pt. on medications Rhythm:regular Rate:Normal     Neuro/Psych Tourette'snegative neurological ROS  negative psych ROS   GI/Hepatic negative GI ROS, Neg liver ROS,   Endo/Other  Morbid obesity  Renal/GU negative Renal ROS  negative genitourinary   Musculoskeletal   Abdominal (+) + obese,   Peds  Hematology negative hematology ROS (+)   Anesthesia Other Findings   Reproductive/Obstetrics negative OB ROS                         Anesthesia Physical Anesthesia Plan  ASA: III  Anesthesia Plan: General   Post-op Pain Management:    Induction: Intravenous  Airway Management Planned: Oral ETT  Additional Equipment:   Intra-op Plan:   Post-operative Plan: Extubation in OR  Informed Consent: I have reviewed the patients History and Physical, chart, labs and discussed the procedure including the risks, benefits and alternatives for the proposed anesthesia with the patient or authorized representative who has indicated his/her understanding and acceptance.   Dental Advisory Given  Plan Discussed with: CRNA and Surgeon  Anesthesia Plan Comments:        Anesthesia Quick Evaluation

## 2013-08-01 ENCOUNTER — Encounter (HOSPITAL_COMMUNITY): Payer: Self-pay | Admitting: *Deleted

## 2013-08-01 ENCOUNTER — Inpatient Hospital Stay (HOSPITAL_COMMUNITY)
Admission: RE | Admit: 2013-08-01 | Discharge: 2013-08-04 | DRG: 330 | Disposition: A | Discharge status: Home / Self Care | Payer: 59 | Source: Ambulatory Visit | Attending: General Surgery | Admitting: General Surgery

## 2013-08-01 ENCOUNTER — Ambulatory Visit (HOSPITAL_COMMUNITY): Payer: 59 | Admitting: Anesthesiology

## 2013-08-01 ENCOUNTER — Encounter (HOSPITAL_COMMUNITY)
Admission: RE | Disposition: A | Discharge status: Home / Self Care | Payer: Self-pay | Source: Ambulatory Visit | Attending: General Surgery

## 2013-08-01 ENCOUNTER — Encounter (HOSPITAL_COMMUNITY): Payer: Self-pay | Admitting: Anesthesiology

## 2013-08-01 DIAGNOSIS — K5732 Diverticulitis of large intestine without perforation or abscess without bleeding: Principal | ICD-10-CM | POA: Diagnosis present

## 2013-08-01 DIAGNOSIS — Z6841 Body Mass Index (BMI) 40.0 and over, adult: Secondary | ICD-10-CM

## 2013-08-01 DIAGNOSIS — I10 Essential (primary) hypertension: Secondary | ICD-10-CM | POA: Diagnosis present

## 2013-08-01 DIAGNOSIS — Z01812 Encounter for preprocedural laboratory examination: Secondary | ICD-10-CM

## 2013-08-01 HISTORY — PX: LAPAROSCOPIC PARTIAL COLECTOMY: SHX5907

## 2013-08-01 LAB — TYPE AND SCREEN
ABO/RH(D): A POS
Antibody Screen: NEGATIVE

## 2013-08-01 SURGERY — LAPAROSCOPIC PARTIAL COLECTOMY
Anesthesia: General | Site: Abdomen | Wound class: Clean Contaminated

## 2013-08-01 MED ORDER — DEXTROSE 5 % IV SOLN
3.0000 g | INTRAVENOUS | Status: AC
  Administered 2013-08-01: 3 g via INTRAVENOUS
  Filled 2013-08-01: qty 3000

## 2013-08-01 MED ORDER — CISATRACURIUM BESYLATE (PF) 10 MG/5ML IV SOLN
INTRAVENOUS | Status: DC | PRN
  Administered 2013-08-01: 10 mg via INTRAVENOUS
  Administered 2013-08-01: 4 mg via INTRAVENOUS
  Administered 2013-08-01: 2 mg via INTRAVENOUS
  Administered 2013-08-01: 4 mg via INTRAVENOUS
  Administered 2013-08-01: 2 mg via INTRAVENOUS
  Administered 2013-08-01: 6 mg via INTRAVENOUS

## 2013-08-01 MED ORDER — LACTATED RINGERS IV SOLN
INTRAVENOUS | Status: DC
Start: 2013-08-01 — End: 2013-08-01
  Administered 2013-08-01: 15:00:00 via INTRAVENOUS

## 2013-08-01 MED ORDER — CEFAZOLIN SODIUM 1-5 GM-% IV SOLN
INTRAVENOUS | Status: AC
  Filled 2013-08-01: qty 50

## 2013-08-01 MED ORDER — ALVIMOPAN 12 MG PO CAPS
12.0000 mg | ORAL_CAPSULE | Freq: Two times a day (BID) | ORAL | Status: DC
  Administered 2013-08-02 – 2013-08-04 (×5): 12 mg via ORAL
  Filled 2013-08-01 (×6): qty 1

## 2013-08-01 MED ORDER — CEFAZOLIN SODIUM-DEXTROSE 2-3 GM-% IV SOLR
INTRAVENOUS | Status: AC
  Filled 2013-08-01: qty 50

## 2013-08-01 MED ORDER — FENTANYL CITRATE 0.05 MG/ML IJ SOLN
INTRAMUSCULAR | Status: DC | PRN
  Administered 2013-08-01: 50 ug via INTRAVENOUS
  Administered 2013-08-01 (×4): 100 ug via INTRAVENOUS
  Administered 2013-08-01: 50 ug via INTRAVENOUS

## 2013-08-01 MED ORDER — HEPARIN SODIUM (PORCINE) 5000 UNIT/ML IJ SOLN
5000.0000 [IU] | Freq: Once | INTRAMUSCULAR | Status: AC
  Administered 2013-08-01: 5000 [IU] via SUBCUTANEOUS
  Filled 2013-08-01: qty 1

## 2013-08-01 MED ORDER — NEOSTIGMINE METHYLSULFATE 1 MG/ML IJ SOLN
INTRAMUSCULAR | Status: DC | PRN
  Administered 2013-08-01: 2 mg via INTRAVENOUS

## 2013-08-01 MED ORDER — ONDANSETRON HCL 4 MG/2ML IJ SOLN
INTRAMUSCULAR | Status: DC | PRN
  Administered 2013-08-01 (×2): 2 mg via INTRAVENOUS

## 2013-08-01 MED ORDER — MORPHINE SULFATE (PF) 1 MG/ML IV SOLN
INTRAVENOUS | Status: AC
  Filled 2013-08-01: qty 25

## 2013-08-01 MED ORDER — DIPHENHYDRAMINE HCL 12.5 MG/5ML PO ELIX: 12.5000 mg | ORAL_SOLUTION | Freq: Four times a day (QID) | ORAL | Status: DC | PRN

## 2013-08-01 MED ORDER — ALVIMOPAN 12 MG PO CAPS
12.0000 mg | ORAL_CAPSULE | Freq: Once | ORAL | Status: AC
  Administered 2013-08-01: 12 mg via ORAL
  Filled 2013-08-01: qty 1

## 2013-08-01 MED ORDER — LACTATED RINGERS IV BOLUS (SEPSIS)
500.0000 mL | Freq: Once | INTRAVENOUS | Status: AC
  Administered 2013-08-01: 500 mL via INTRAVENOUS

## 2013-08-01 MED ORDER — SUCCINYLCHOLINE CHLORIDE 20 MG/ML IJ SOLN
INTRAMUSCULAR | Status: DC | PRN
  Administered 2013-08-01: 140 mg via INTRAVENOUS

## 2013-08-01 MED ORDER — LACTATED RINGERS IV SOLN
INTRAVENOUS | Status: DC
  Administered 2013-08-02: 01:00:00 via INTRAVENOUS

## 2013-08-01 MED ORDER — NALOXONE HCL 0.4 MG/ML IJ SOLN: 0.4000 mg | INTRAMUSCULAR | Status: DC | PRN

## 2013-08-01 MED ORDER — ONDANSETRON HCL 4 MG/2ML IJ SOLN
4.0000 mg | Freq: Four times a day (QID) | INTRAMUSCULAR | Status: DC | PRN
  Administered 2013-08-01: 4 mg via INTRAVENOUS
  Filled 2013-08-01: qty 2

## 2013-08-01 MED ORDER — DEXAMETHASONE SODIUM PHOSPHATE 10 MG/ML IJ SOLN
INTRAMUSCULAR | Status: DC | PRN
  Administered 2013-08-01: 10 mg via INTRAVENOUS

## 2013-08-01 MED ORDER — ENOXAPARIN SODIUM 40 MG/0.4ML ~~LOC~~ SOLN
40.0000 mg | SUBCUTANEOUS | Status: DC
  Administered 2013-08-02 – 2013-08-03 (×2): 40 mg via SUBCUTANEOUS
  Filled 2013-08-01 (×3): qty 0.4

## 2013-08-01 MED ORDER — MORPHINE SULFATE (PF) 1 MG/ML IV SOLN
INTRAVENOUS | Status: DC
  Administered 2013-08-01: 14:00:00 via INTRAVENOUS
  Administered 2013-08-01 – 2013-08-02 (×2): 10.5 mg via INTRAVENOUS
  Administered 2013-08-02: 10:00:00 via INTRAVENOUS
  Administered 2013-08-02: 1.5 mg via INTRAVENOUS
  Administered 2013-08-02: 13.5 mg via INTRAVENOUS
  Administered 2013-08-02: 9 mg via INTRAVENOUS
  Administered 2013-08-02: 25 mg via INTRAVENOUS
  Administered 2013-08-02: 7.5 mg via INTRAVENOUS
  Administered 2013-08-02: 11.73 mg via INTRAVENOUS
  Administered 2013-08-02: 3 mg via INTRAVENOUS
  Administered 2013-08-03: 22.25 mg via INTRAVENOUS
  Administered 2013-08-03: 12 mg via INTRAVENOUS
  Filled 2013-08-01 (×4): qty 25

## 2013-08-01 MED ORDER — LABETALOL HCL 5 MG/ML IV SOLN
INTRAVENOUS | Status: DC | PRN
  Administered 2013-08-01: 5 mg via INTRAVENOUS

## 2013-08-01 MED ORDER — LIDOCAINE HCL (CARDIAC) 20 MG/ML IV SOLN
INTRAVENOUS | Status: DC | PRN
  Administered 2013-08-01: 100 mg via INTRAVENOUS

## 2013-08-01 MED ORDER — PROPOFOL 10 MG/ML IV BOLUS
INTRAVENOUS | Status: DC | PRN
  Administered 2013-08-01: 210 mg via INTRAVENOUS

## 2013-08-01 MED ORDER — LACTATED RINGERS IV SOLN
INTRAVENOUS | Status: DC | PRN
  Administered 2013-08-01 (×3): via INTRAVENOUS

## 2013-08-01 MED ORDER — HYDRALAZINE HCL 20 MG/ML IJ SOLN
INTRAMUSCULAR | Status: DC | PRN
  Administered 2013-08-01 (×4): 5 mg via INTRAVENOUS

## 2013-08-01 MED ORDER — HYDROMORPHONE HCL PF 1 MG/ML IJ SOLN
INTRAMUSCULAR | Status: DC | PRN
  Administered 2013-08-01: 2 mg via INTRAVENOUS

## 2013-08-01 MED ORDER — BUPIVACAINE-EPINEPHRINE PF 0.25-1:200000 % IJ SOLN
INTRAMUSCULAR | Status: DC | PRN
  Administered 2013-08-01: 44 mL

## 2013-08-01 MED ORDER — METRONIDAZOLE IN NACL 5-0.79 MG/ML-% IV SOLN
INTRAVENOUS | Status: AC
  Filled 2013-08-01: qty 100

## 2013-08-01 MED ORDER — BUPIVACAINE-EPINEPHRINE 0.25% -1:200000 IJ SOLN
INTRAMUSCULAR | Status: AC
  Filled 2013-08-01: qty 1

## 2013-08-01 MED ORDER — HYDROMORPHONE HCL PF 1 MG/ML IJ SOLN
0.2500 mg | INTRAMUSCULAR | Status: DC | PRN
  Administered 2013-08-01 (×2): 0.5 mg via INTRAVENOUS

## 2013-08-01 MED ORDER — HYDROMORPHONE HCL PF 1 MG/ML IJ SOLN
INTRAMUSCULAR | Status: AC
  Filled 2013-08-01: qty 1

## 2013-08-01 MED ORDER — LACTATED RINGERS IR SOLN
Status: DC | PRN
  Administered 2013-08-01: 1000 mL

## 2013-08-01 MED ORDER — CEFAZOLIN SODIUM-DEXTROSE 2-3 GM-% IV SOLR
2.0000 g | Freq: Three times a day (TID) | INTRAVENOUS | Status: AC
  Administered 2013-08-01: 2 g via INTRAVENOUS
  Filled 2013-08-01: qty 50

## 2013-08-01 MED ORDER — GLYCOPYRROLATE 0.2 MG/ML IJ SOLN
INTRAMUSCULAR | Status: DC | PRN
  Administered 2013-08-01: 0.4 mg via INTRAVENOUS

## 2013-08-01 MED ORDER — METRONIDAZOLE IN NACL 5-0.79 MG/ML-% IV SOLN
500.0000 mg | Freq: Three times a day (TID) | INTRAVENOUS | Status: AC
  Administered 2013-08-01: 500 mg via INTRAVENOUS
  Filled 2013-08-01: qty 100

## 2013-08-01 MED ORDER — HYDROCHLOROTHIAZIDE 25 MG PO TABS
25.0000 mg | ORAL_TABLET | Freq: Every morning | ORAL | Status: DC
Start: 2013-08-02 — End: 2013-08-04
  Administered 2013-08-02 – 2013-08-04 (×3): 25 mg via ORAL
  Filled 2013-08-01 (×3): qty 1

## 2013-08-01 MED ORDER — DIPHENHYDRAMINE HCL 50 MG/ML IJ SOLN: 12.5000 mg | Freq: Four times a day (QID) | INTRAMUSCULAR | Status: DC | PRN

## 2013-08-01 MED ORDER — SODIUM CHLORIDE 0.9 % IJ SOLN: 9.0000 mL | INTRAMUSCULAR | Status: DC | PRN

## 2013-08-01 MED ORDER — MIDAZOLAM HCL 5 MG/5ML IJ SOLN
INTRAMUSCULAR | Status: DC | PRN
  Administered 2013-08-01 (×2): 1 mg via INTRAVENOUS

## 2013-08-01 MED ORDER — METRONIDAZOLE IN NACL 5-0.79 MG/ML-% IV SOLN
500.0000 mg | INTRAVENOUS | Status: AC
  Administered 2013-08-01: 500 g via INTRAVENOUS
  Filled 2013-08-01: qty 100

## 2013-08-01 SURGICAL SUPPLY — 75 items
APPLIER CLIP 5 13 M/L LIGAMAX5 (MISCELLANEOUS)
BLADE EXTENDED COATED 6.5IN (ELECTRODE) ×2 IMPLANT
BLADE HEX COATED 2.75 (ELECTRODE) ×6 IMPLANT
BLADE SURG SZ10 CARB STEEL (BLADE) ×2 IMPLANT
CABLE HIGH FREQUENCY MONO STRZ (ELECTRODE) ×2 IMPLANT
CANISTER SUCTION 2500CC (MISCELLANEOUS) ×4 IMPLANT
CELLS DAT CNTRL 66122 CELL SVR (MISCELLANEOUS) IMPLANT
CHLORAPREP W/TINT 26ML (MISCELLANEOUS) ×2 IMPLANT
CLIP APPLIE 5 13 M/L LIGAMAX5 (MISCELLANEOUS) IMPLANT
CLOTH BEACON ORANGE TIMEOUT ST (SAFETY) ×2 IMPLANT
COUNTER NEEDLE 20 DBL MAG RED (NEEDLE) ×2 IMPLANT
COVER MAYO STAND STRL (DRAPES) ×4 IMPLANT
DECANTER SPIKE VIAL GLASS SM (MISCELLANEOUS) ×2 IMPLANT
DRAIN CHANNEL 19F RND (DRAIN) IMPLANT
DRAPE LAPAROSCOPIC ABDOMINAL (DRAPES) ×2 IMPLANT
DRAPE LG THREE QUARTER DISP (DRAPES) ×2 IMPLANT
DRAPE UTILITY XL STRL (DRAPES) ×6 IMPLANT
DRAPE WARM FLUID 44X44 (DRAPE) ×2 IMPLANT
DRSG OPSITE POSTOP 4X6 (GAUZE/BANDAGES/DRESSINGS) ×2 IMPLANT
ELECT CAUTERY BLADE 6.4 (BLADE) ×2 IMPLANT
ELECT REM PT RETURN 9FT ADLT (ELECTROSURGICAL) ×2
ELECTRODE REM PT RTRN 9FT ADLT (ELECTROSURGICAL) ×1 IMPLANT
EVACUATOR SILICONE 100CC (DRAIN) IMPLANT
GLOVE BIO SURGEON STRL SZ 6.5 (GLOVE) ×8 IMPLANT
GLOVE BIOGEL PI IND STRL 7.0 (GLOVE) ×4 IMPLANT
GLOVE BIOGEL PI INDICATOR 7.0 (GLOVE) ×4
GOWN STRL REIN 2XL XLG LVL4 (GOWN DISPOSABLE) ×4 IMPLANT
GOWN STRL REIN XL XLG (GOWN DISPOSABLE) ×14 IMPLANT
KIT BASIN OR (CUSTOM PROCEDURE TRAY) ×2 IMPLANT
LEGGING LITHOTOMY PAIR STRL (DRAPES) ×2 IMPLANT
LIGASURE IMPACT 36 18CM CVD LR (INSTRUMENTS) IMPLANT
LUBRICANT JELLY K Y 4OZ (MISCELLANEOUS) ×2 IMPLANT
NS IRRIG 1000ML POUR BTL (IV SOLUTION) ×2 IMPLANT
PENCIL BUTTON HOLSTER BLD 10FT (ELECTRODE) ×6 IMPLANT
RTRCTR WOUND ALEXIS 18CM MED (MISCELLANEOUS)
SCISSORS LAP 5X35 DISP (ENDOMECHANICALS) ×2 IMPLANT
SEALER TISSUE G2 CVD JAW 35 (ENDOMECHANICALS) ×1 IMPLANT
SEALER TISSUE G2 CVD JAW 45CM (ENDOMECHANICALS) ×1
SET IRRIG TUBING LAPAROSCOPIC (IRRIGATION / IRRIGATOR) ×2 IMPLANT
SLEEVE XCEL OPT CAN 5 100 (ENDOMECHANICALS) ×2 IMPLANT
SOLUTION ANTI FOG 6CC (MISCELLANEOUS) ×2 IMPLANT
SPONGE GAUZE 4X4 12PLY (GAUZE/BANDAGES/DRESSINGS) ×2 IMPLANT
SPONGE LAP 18X18 X RAY DECT (DISPOSABLE) ×6 IMPLANT
STAPLER CIRC CVD 29MM 37CM (STAPLE) ×2 IMPLANT
STAPLER CUT CVD 40MM BLUE (STAPLE) ×2 IMPLANT
STAPLER CUT RELOAD BLUE (STAPLE) ×2 IMPLANT
STAPLER VISISTAT 35W (STAPLE) ×2 IMPLANT
SUCTION POOLE TIP (SUCTIONS) ×2 IMPLANT
SUT ETHILON 2 0 PS N (SUTURE) IMPLANT
SUT NOVA 1 T20/GS 25DT (SUTURE) ×4 IMPLANT
SUT PDS AB 1 CTX 36 (SUTURE) IMPLANT
SUT PDS AB 1 TP1 96 (SUTURE) ×4 IMPLANT
SUT PROLENE 2 0 KS (SUTURE) ×2 IMPLANT
SUT SILK 2 0 (SUTURE) ×1
SUT SILK 2 0 SH CR/8 (SUTURE) ×4 IMPLANT
SUT SILK 2-0 18XBRD TIE 12 (SUTURE) ×1 IMPLANT
SUT SILK 3 0 (SUTURE) ×1
SUT SILK 3 0 SH CR/8 (SUTURE) ×2 IMPLANT
SUT SILK 3-0 18XBRD TIE 12 (SUTURE) ×1 IMPLANT
SUT VIC AB 2-0 SH 18 (SUTURE) ×2 IMPLANT
SUT VIC AB 4-0 PS2 18 (SUTURE) ×2 IMPLANT
SUT VICRYL 2 0 18  UND BR (SUTURE)
SUT VICRYL 2 0 18 UND BR (SUTURE) IMPLANT
SYR BULB IRRIGATION 50ML (SYRINGE) ×2 IMPLANT
SYS LAPSCP GELPORT 120MM (MISCELLANEOUS) ×2
SYSTEM LAPSCP GELPORT 120MM (MISCELLANEOUS) ×1 IMPLANT
TAPE CLOTH 4X10 WHT NS (GAUZE/BANDAGES/DRESSINGS) ×2 IMPLANT
TOWEL OR 17X26 10 PK STRL BLUE (TOWEL DISPOSABLE) ×4 IMPLANT
TOWEL OR NON WOVEN STRL DISP B (DISPOSABLE) ×4 IMPLANT
TRAY FOLEY CATH 14FRSI W/METER (CATHETERS) ×2 IMPLANT
TRAY LAP CHOLE (CUSTOM PROCEDURE TRAY) ×2 IMPLANT
TROCAR BLADELESS OPT 5 100 (ENDOMECHANICALS) ×4 IMPLANT
TROCAR XCEL BLUNT TIP 100MML (ENDOMECHANICALS) ×2 IMPLANT
TUBING FILTER THERMOFLATOR (ELECTROSURGICAL) ×4 IMPLANT
YANKAUER SUCT BULB TIP 10FT TU (MISCELLANEOUS) ×4 IMPLANT

## 2013-08-01 NOTE — Op Note (Signed)
08/01/2013  1:36 PM  PATIENT:  Christopher Williamson  32 y.o. male  Patient Care Team: Darrow Bussing, MD as PCP - General (Family Medicine)  PRE-OPERATIVE DIAGNOSIS:  recurrent diverticulititis  POST-OPERATIVE DIAGNOSIS:  recurrent diverticulititis  PROCEDURE:LAPAROSCOPIC sigmoidectomy with splenic flexure moblization.  SURGEON:  Surgeon(s): Romie Levee, MD  ASSISTANT: [place]   ANESTHESIA:   general  EBL: 50ml  Total I/O In: 2700 [I.V.:2700] Out: 275 [Urine:225; Blood:50]  Delay start of Pharmacological VTE agent (>24hrs) due to surgical blood loss or risk of bleeding:  yes  DRAINS: none   SPECIMEN:  Source of Specimen:  sigmoid  DISPOSITION OF SPECIMEN:  PATHOLOGY  COUNTS:  YES  PLAN OF CARE: Admit to inpatient   PATIENT DISPOSITION:  PACU - hemodynamically stable.  INDICATION: this is a 32 year old male who has an approximately five-year history of recurrent left lower quadrant pain. He has had at least 2 CT documented cases of diverticulitis. He has had several more that have responded to antibiotics well. Given his age and the cyclical nature of his disease I have recommended a sigmoidectomy. The risk and benefits of the procedure were explained to the patient prior to the OR and consent was signed and placed on chart.  OR FINDINGS: sigmoid colon with chronic inflammation, minimal diverticuli  DESCRIPTION: The patient was identified in the preoperative holding area and taken to the OR where they were laid supine on the operating room table. General anesthesia was smoothly induced.  The patient was then placed in lithotomy position and a Foley catheter was inserted under sterile conditions.  The patient was then prepped and draped in the usual sterile fashion. A surgical timeout was performed indicating the correct patient, procedure, positioning and preoperative antibiotics. SCDs were noted to be in place and functioning prior to the operation.  I began by making a  supraumbilical incision using a scalpel. Blunt dissection was carried down to the level of the fascia. The fascia was then elevated using 2 Kocher clamps. The fascia was incised midline. The peritoneum was entered bluntly. A pursestring suture was placed into the fascia using an 0 Vicryl suture. The [name] was then placed into the abdomen and insufflated to approximately 15 mm of mercury. The abdomen was evaluated. There were no injuries upon entry. The abdomen appeared normal. The small bowel was normal in caliber. The liver was healthy appearing. The large bowel appeared normal except for some adhesions noted in the left lower quadrant abdominal wall. These were taken down using combination of sharp and blunt dissection. I placed 25 mm ports under direct visualization. One was in the right upper quadrant one in the right lower quadrant. Another 5 mm port was placed in the left upper quadrant for help with retraction of the splenic flexure mobilization.  There was a portion of small bowel that was adherent to this portion of disease. This is taken down using mostly blunt dissection. The remaining colon was then mobilized off of the lateral sidewall down the white line of Toldt to the level of the splenic flexure. At this point a Pfannenstiel incision was made approximately 2 fingerbreadths above the pubic symphysis. An Alexis wound protector was placed and the GelPort was placed over this. I continued with blunt dissection using a hand-assisted laparoscopic technique. The colon was freed up from the left lower quadrant. The ureter was left in place. I was unable to mobilize the sigmoid out of the abdomen and therefore I felt like I would not be able to  have a good anastomosis without tension. I decided to take down the splenic flexure. This was done using an Enseal device for hemostasis.  The entire splenic flexure was mobilized. Once this was completed we had good length down into the pelvis. I removed the GelPort  and mobilized the colon out of the incision. I identified a portion proximal to the area of inflammation and transected this with a blue load contour stapler. I then divided the mesentery high close to the colon to avoid injury to the retroperitoneal structures. This was done using the Enseal device. I mobilized down to the top of the rectum through the mesentery. After this was completed the sigmoid was transected at the rectosigmoid junction using a blue load contour device. This freed our specimen which was sent to pathology for further examination. The remaining colon easily mobilized down to the pelvis at this time.  I evaluated the proximal colon. A 29 mm EEA stapler was selected. A pursestring device was brought onto the field and a 2-0 Prolene pursestring was placed along the proximal colon. This was tied over a 29 mm EEA blue load anvil.  This was then dropped back into the abdomen. EEA stapler was then introduced into the patient's rectum. It was brought out through the proximal staple line under direct visualization. The anastomosis was created without tension. The rectum was insufflated using a proctoscope. There was no airleak noted.  The pelvis was irrigated with normal saline. Hemostasis was good. We then switched to a clean instruments and clean drapes. Our gowns and gloves were switched as well. The peritoneum was then closed using a running 2-0 Vicryl suture. The fascia was closed using 2, 0 looped PDS sutures. The subcutaneous layer was closed using interrupted 2-0 Vicryl sutures. The skin was then closed with a running 4-0 Vicryl subcuticular suture. The abdomen was then reinsufflated. The wound looked good from the inside. Hemostasis also appeared to be stable. The omentum was then brought over the small bowel. The 5 mm ports were removed under direct visualization.  The abdomen was  Desufflated. The pursestring suture around the umbilical [name] was closed. 2-0 Vicryl sutures were used to close  the subcuticular area and a 4-0 Vicryl suture was used to close the skin of all port sites.  Dermabond was placed in the port sites. A sterile dressing was placed over the Pfannenstiel incision. The patient was then awakened from anesthesia and sent to the post anesthesia care unit in stable condition. All counts were correct per operating room staff.

## 2013-08-01 NOTE — Transfer of Care (Signed)
Immediate Anesthesia Transfer of Care Note  Patient: Christopher Williamson  Procedure(s) Performed: Procedure(s): LAPAROSCOPIC sigmoidectomy with splenic flexure moblization. (N/A)  Patient Location: PACU  Anesthesia Type:General  Level of Consciousness: awake, patient cooperative, lethargic and responds to stimulation  Airway & Oxygen Therapy: Patient Spontanous Breathing and Patient connected to face mask oxygen  Post-op Assessment: Report given to PACU RN, Post -op Vital signs reviewed and stable and Patient moving all extremities  Post vital signs: Reviewed and stable  Complications: No apparent anesthesia complications

## 2013-08-01 NOTE — Progress Notes (Signed)
Dr. Leta Jungling made aware of patient's heart rates and blood pressures- Orders given.

## 2013-08-01 NOTE — Anesthesia Postprocedure Evaluation (Signed)
  Anesthesia Post-op Note  Patient: Christopher Williamson  Procedure(s) Performed: Procedure(s) (LRB): LAPAROSCOPIC sigmoidectomy with splenic flexure moblization. (N/A)  Patient Location: PACU  Anesthesia Type: General  Level of Consciousness: awake and alert   Airway and Oxygen Therapy: Patient Spontanous Breathing  Post-op Pain: mild  Post-op Assessment: Post-op Vital signs reviewed, Patient's Cardiovascular Status Stable, Respiratory Function Stable, Patent Airway and No signs of Nausea or vomiting  Last Vitals:  Filed Vitals:   08/01/13 1415  BP: 111/70  Pulse: 106  Temp:   Resp: 19    Post-op Vital Signs: stable   Complications: No apparent anesthesia complications

## 2013-08-01 NOTE — Preoperative (Signed)
Beta Blockers   Reason not to administer Beta Blockers:Not Applicable 

## 2013-08-01 NOTE — Interval H&P Note (Signed)
History and Physical Interval Note:  08/01/2013 8:25 AM  Christopher Williamson  has presented today for surgery, with the diagnosis of recurrent diverticulititis  The various methods of treatment have been discussed with the patient and family. After consideration of risks, benefits and other options for treatment, the patient has consented to  Procedure(s): LAPAROSCOPIC PARTIAL COLECTOMY (N/A) as a surgical intervention .  The patient's history has been reviewed, patient examined, no change in status, stable for surgery.  I have reviewed the patient's chart and labs.  Questions were answered to the patient's satisfaction.     Vanita Panda, MD  Colorectal and General Surgery [facility]

## 2013-08-01 NOTE — Progress Notes (Signed)
Repeat 500 cc LR IVB given as ordered.

## 2013-08-01 NOTE — Anesthesia Procedure Notes (Signed)
Procedure Name: Intubation Date/Time: 08/01/2013 9:29 AM Performed by: Edison Pace Pre-anesthesia Checklist: Patient identified, Timeout performed, Emergency Drugs available, Suction available and Patient being monitored Patient Re-evaluated:Patient Re-evaluated prior to inductionOxygen Delivery Method: Circle system utilized Preoxygenation: Pre-oxygenation with 100% oxygen Intubation Type: Cricoid Pressure applied and IV induction Ventilation: Mask ventilation without difficulty Laryngoscope Size: Mac and 4 Grade View: Grade II Tube type: Oral Tube size: 8.0 mm Number of attempts: 1 Airway Equipment and Method: Stylet Placement Confirmation: positive ETCO2,  ETT inserted through vocal cords under direct vision and breath sounds checked- equal and bilateral Secured at: 21 cm Tube secured with: Tape Dental Injury: Teeth and Oropharynx as per pre-operative assessment  Difficulty Due To: Difficulty was anticipated, Difficult Airway- due to anterior larynx, Difficult Airway- due to limited oral opening and Difficult Airway- due to large tongue Future Recommendations: Recommend- induction with short-acting agent, and alternative techniques readily available

## 2013-08-01 NOTE — Progress Notes (Signed)
500 cc LR IVB given as ordered. 

## 2013-08-01 NOTE — Progress Notes (Signed)
Dr. Leta Jungling made aware of patient's heart rates and blood pressures after 1000cc LR IVB GIVEN- O.K. To go to floor

## 2013-08-01 NOTE — H&P (View-Only) (Signed)
Chief Complaint  Patient presents with  . New Evaluation    eval for left colon resection    HISTORY: Christopher Williamson is a 32 y.o. male who presents to the office with abdominal pain.  He has had recurrent abd pain about every 3 months.  He has had 2 CT documented cases of mild diverticulitis without complications.   His symptoms start with abd discomfort and fatigue.  Then he develops pain in his back that radiates to his abd.  His worst pain is in his LLQ.  This had been occurring for 5 yrs.  He has tried antibiotics in the past, which help, but he has to take narcotics for several days to get through the worst of his symptoms.  Nothing he does or eats makes the symptoms worse.  His bowel habits are regular and his bowel movements are hard, but he denies straining.  His fiber intake is mostly through metamucil.  His last colonoscopy was in May and showed diverticulosis and rectosigmoid inflammation.     Past Medical History  Diagnosis Date  . Diverticulitis   . Hypertension   . Tourette's syndrome       Past Surgical History  Procedure Laterality Date  . Tonsillectomy    . Wisdom tooth extraction          Current Outpatient Prescriptions  Medication Sig Dispense Refill  . hydrochlorothiazide (HYDRODIURIL) 25 MG tablet Take 25 mg by mouth daily.      . metroNIDAZOLE (FLAGYL) 500 MG tablet Take 1 tablet (500 mg total) by mouth as directed.  3 tablet  0   No current facility-administered medications for this visit.      Allergies  Allergen Reactions  . Orap (Pimozide) Anaphylaxis  . Mushroom Extract Complex Other (See Comments)    Mushrooms cause gi upset      History reviewed. No pertinent family history.  History   Social History  . Marital Status: Married    Spouse Name: N/A    Number of Children: N/A  . Years of Education: N/A   Social History Main Topics  . Smoking status: Former Smoker -- 1.00 packs/day for 15 years    Types: Cigarettes    Quit date: 05/17/2013  .  Smokeless tobacco: Never Used  . Alcohol Use: 3.6 oz/week    6 Cans of beer per week     Comment: x 2 days a week  . Drug Use: No  . Sexually Active: Yes    Birth Control/ Protection: None   Other Topics Concern  . None   Social History Narrative  . None      REVIEW OF SYSTEMS - PERTINENT POSITIVES ONLY: Review of Systems - General ROS: negative for - chills, fever or weight loss Hematological and Lymphatic ROS: negative for - bleeding problems, blood clots or bruising Respiratory ROS: no cough, shortness of breath, or wheezing Cardiovascular ROS: no chest pain or dyspnea on exertion Gastrointestinal ROS: positive for - abdominal pain negative for - blood in stools, change in bowel habits, change in stools, constipation or nausea/vomiting Genito-Urinary ROS: no dysuria, trouble voiding, or hematuria  EXAM: Filed Vitals:   07/11/13 0907  BP: 182/94  Pulse: 84  Temp: 98.1 F (36.7 C)  Resp: 20    General appearance: alert and cooperative Resp: clear to auscultation bilaterally Cardio: regular rate and rhythm GI: normal findings: no masses palpable and soft and abnormal findings:  mild tenderness in the suprapubic region and moderate point tenderness in  the LLQ Extremities: extremities normal, atraumatic, no cyanosis or edema    ASSESSMENT AND PLAN Christopher Williamson is a 32 y.o. M with recurrent diverticulitis.  He has had at least 2 CT documented episodes and several other episodes that all responded to antibiotics.  On exam he has point tenderness in his LLQ.  He has tried dietary modifications and fiber supplements with no change in symptoms.  We discussed his options in great detail.  After presenting all of this information, I had a discussion with he and his wife about surgery.  I told them that I felt that this would most likely greatly help his symptoms, but that I could not guarantee this would resolve all of his problems, due to the fact that his pain did not seem to  match CT and lab findings.  I believe he understands this completely and has agreed to proceed with surgical resection.  I will attempt to perform this laparoscopically, if possible.    The surgery and anatomy were described to the patient as well as the risks of surgery and the possible complications.  These include: Bleeding, infection and possible wound complications such as hernia, damage to adjacent structures, leak of surgical connections, which can lead to other surgeries and possibly an ostomy (5-7%), possible need for other procedures, such as abscess drains in radiology, possible prolonged hospital stay, possible diarrhea from removal of part of the colon, possible constipation from narcotics, prolonged fatigue/weakness or appetite loss, possible complications of their medical problems such as heart disease or arrhythmias or lung problems, death (less than 1%). I believe the patient understands and wishes to proceed with the surgery.     Vanita Panda, MD Colon and Rectal Surgery / General Surgery [facility]      Visit Diagnoses: 1. Diverticulitis of colon without hemorrhage   2. Preop examination     Primary Care Physician: Darrow Bussing, MD

## 2013-08-02 ENCOUNTER — Encounter (HOSPITAL_COMMUNITY): Payer: Self-pay | Admitting: General Surgery

## 2013-08-02 ENCOUNTER — Encounter (INDEPENDENT_AMBULATORY_CARE_PROVIDER_SITE_OTHER): Payer: Self-pay

## 2013-08-02 LAB — CBC
MCH: 28.9 pg (ref 26.0–34.0)
MCV: 87.7 fL (ref 78.0–100.0)
Platelets: 327 10*3/uL (ref 150–400)
RDW: 13.4 % (ref 11.5–15.5)
WBC: 19.5 10*3/uL — ABNORMAL HIGH (ref 4.0–10.5)

## 2013-08-02 LAB — BASIC METABOLIC PANEL
CO2: 28 mEq/L (ref 19–32)
Calcium: 9.1 mg/dL (ref 8.4–10.5)
Creatinine, Ser: 0.86 mg/dL (ref 0.50–1.35)
GFR calc non Af Amer: >90 mL/min (ref >90)
Sodium: 135 mEq/L (ref 135–145)

## 2013-08-02 MED ORDER — KCL IN DEXTROSE-NACL 20-5-0.45 MEQ/L-%-% IV SOLN
INTRAVENOUS | Status: DC
  Administered 2013-08-02 (×2): via INTRAVENOUS
  Filled 2013-08-02 (×3): qty 1000

## 2013-08-02 MED FILL — Metronidazole in NaCl 0.79% IV Soln 500 MG/100ML: INTRAVENOUS | Qty: 100 | Status: AC

## 2013-08-02 NOTE — Progress Notes (Signed)
1 Day Post-Op lap sigmoidectomy Subjective: Pain controlled, good uop, vitals stabilizing   Objective: Vital signs in last 24 hours: Temp:  [97.3 F (36.3 C)-98.9 F (37.2 C)] 98.8 F (37.1 C) (08/19 0553) Pulse Rate:  [92-120] 92 (08/19 0553) Resp:  [12-23] 21 (08/19 0732) BP: (111-167)/(50-85) 117/66 mmHg (08/19 0553) SpO2:  [92 %-99 %] 96 % (08/19 0732) FiO2 (%):  [93 %] 93 % (08/18 1530)   Intake/Output from previous day: 08/18 0701 - 08/19 0700 In: 5966.7 [I.V.:4916.7; IV Piggyback:1050] Out: 1005 [Urine:955; Blood:50] Intake/Output this shift:     General appearance: alert and cooperative GI: normal findings: soft, non-tender  Incision: slight clear drainage present, bruising noted  Lab Results:   Recent Labs  08/02/13 0403  WBC 19.5*  HGB 14.3  HCT 43.3  PLT 327   BMET  Recent Labs  08/02/13 0403  NA 135  K 3.9  CL 98  CO2 28  GLUCOSE 124*  BUN 11  CREATININE 0.86  CALCIUM 9.1   PT/INR No results found for this basename: LABPROT, INR,  in the last 72 hours ABG No results found for this basename: PHART, PCO2, PO2, HCO3,  in the last 72 hours  MEDS, Scheduled . alvimopan  12 mg Oral BID  . enoxaparin (LOVENOX) injection  40 mg Subcutaneous Q24H  . hydrochlorothiazide  25 mg Oral q morning - 10a  . morphine   Intravenous Q4H    Studies/Results: No results found.  Assessment: s/p Procedure(s): LAPAROSCOPIC sigmoidectomy with splenic flexure moblization. Patient Active Problem List   Diagnosis Date Noted  . Diverticulitis of colon without hemorrhage 07/11/2013    Doing well, expected post op course  Plan: d/c foley Advance diet to clears Ambulate today   LOS: 1 day     .Vanita Panda, MD [facility], [place] [phone]   08/02/2013 7:47 AM

## 2013-08-02 NOTE — Care Management Note (Signed)
    Page 1 of 1   08/02/2013     11:25:24 AM   CARE MANAGEMENT NOTE 08/02/2013  Patient:  Christopher Williamson, Christopher Williamson   Account Number:  [id number]  Date Initiated:  08/02/2013  Documentation initiated by:  Lorenda Ishihara  Subjective/Objective Assessment:   32 yo male admitted s/p lap colectomy. PTA lived at home with spouse.     Action/Plan:   Home when stable   Anticipated DC Date:  08/05/2013   Anticipated DC Plan:  HOME/SELF CARE      DC Planning Services  CM consult      Choice offered to / List presented to:             Status of service:  Completed, signed off Medicare Important Message given?   (If response is "NO", the following Medicare IM given date fields will be blank) Date Medicare IM given:   Date Additional Medicare IM given:    Discharge Disposition:  HOME/SELF CARE  Per UR Regulation:  Reviewed for med. necessity/level of care/duration of stay  If discussed at Long Length of Stay Meetings, dates discussed:    Comments:

## 2013-08-03 LAB — BASIC METABOLIC PANEL
Calcium: 9 mg/dL (ref 8.4–10.5)
Chloride: 98 mEq/L (ref 96–112)
Creatinine, Ser: 0.85 mg/dL (ref 0.50–1.35)
GFR calc Af Amer: >90 mL/min (ref >90)
Sodium: 136 mEq/L (ref 135–145)

## 2013-08-03 LAB — CBC
Platelets: 277 10*3/uL (ref 150–400)
RBC: 4.74 MIL/uL (ref 4.22–5.81)
RDW: 13.4 % (ref 11.5–15.5)
WBC: 13.4 10*3/uL — ABNORMAL HIGH (ref 4.0–10.5)

## 2013-08-03 MED ORDER — ENOXAPARIN SODIUM 80 MG/0.8ML ~~LOC~~ SOLN
80.0000 mg | SUBCUTANEOUS | Status: DC
  Administered 2013-08-04: 80 mg via SUBCUTANEOUS
  Filled 2013-08-03: qty 0.8

## 2013-08-03 MED ORDER — OXYCODONE-ACETAMINOPHEN 5-325 MG PO TABS
1.0000 | ORAL_TABLET | ORAL | Status: DC | PRN
  Administered 2013-08-03 (×2): 2 via ORAL
  Filled 2013-08-03 (×2): qty 2

## 2013-08-03 MED ORDER — HYDROMORPHONE HCL PF 1 MG/ML IJ SOLN: 0.5000 mg | INTRAMUSCULAR | Status: DC | PRN

## 2013-08-03 NOTE — Progress Notes (Signed)
2 Days Post-Op lap sigmoidectomy Subjective: Pain controlled, excellent uop, vitals stable, Ambulating some, tolerating clears, no BM or flatus  Objective: Vital signs in last 24 hours: Temp:  [97.7 F (36.5 C)-98.3 F (36.8 C)] 98 F (36.7 C) (08/20 0603) Pulse Rate:  [85-97] 85 (08/20 0603) Resp:  [15-21] 17 (08/20 0822) BP: (124-162)/(76-89) 124/76 mmHg (08/20 0603) SpO2:  [54 %-100 %] 99 % (08/20 0822) FiO2 (%):  [42 %-54 %] 54 % (08/20 0417)   Intake/Output from previous day: 08/19 0701 - 08/20 0700 In: 3046.3 [P.O.:1440; I.V.:1606.3] Out: 3450 [Urine:3450] Intake/Output this shift:   General appearance: alert and cooperative GI: normal findings: soft, non-tender, slightly distended  Incision: slight drainage present, bruising noted  Lab Results:   Recent Labs  08/02/13 0403 08/03/13 0402  WBC 19.5* 13.4*  HGB 14.3 13.6  HCT 43.3 41.9  PLT 327 277   BMET  Recent Labs  08/02/13 0403 08/03/13 0402  NA 135 136  K 3.9 3.5  CL 98 98  CO2 28 33*  GLUCOSE 124* 93  BUN 11 11  CREATININE 0.86 0.85  CALCIUM 9.1 9.0   PT/INR No results found for this basename: LABPROT, INR,  in the last 72 hours ABG No results found for this basename: PHART, PCO2, PO2, HCO3,  in the last 72 hours  MEDS, Scheduled . alvimopan  12 mg Oral BID  . enoxaparin (LOVENOX) injection  40 mg Subcutaneous Q24H  . hydrochlorothiazide  25 mg Oral q morning - 10a    Studies/Results: No results found.  Assessment: s/p Procedure(s): LAPAROSCOPIC sigmoidectomy with splenic flexure moblization. Patient Active Problem List   Diagnosis Date Noted  . Diverticulitis of colon without hemorrhage 07/11/2013    Doing well, expected post op course  Plan: Advance diet full liquids Ambulate more today D/C PCA and start PO narcotics Possible d/c tom or Fri    LOS: 2 days     .Vanita Panda, MD [facility], [place] [phone]   08/03/2013 9:41 AM

## 2013-08-04 LAB — BASIC METABOLIC PANEL
Chloride: 97 mEq/L (ref 96–112)
GFR calc Af Amer: >90 mL/min (ref >90)
Potassium: 3.4 mEq/L — ABNORMAL LOW (ref 3.5–5.1)

## 2013-08-04 LAB — CBC
HCT: 40.9 % (ref 39.0–52.0)
Hemoglobin: 13.6 g/dL (ref 13.0–17.0)
RBC: 4.71 MIL/uL (ref 4.22–5.81)
RDW: 13.1 % (ref 11.5–15.5)
WBC: 11.2 10*3/uL — ABNORMAL HIGH (ref 4.0–10.5)

## 2013-08-04 MED ORDER — OXYCODONE-ACETAMINOPHEN 5-325 MG PO TABS: 1.0000 | ORAL_TABLET | ORAL | Status: DC | PRN

## 2013-08-04 NOTE — Discharge Summary (Signed)
Physician Discharge Summary  Patient ID: Christopher Williamson MRN: [id number] DOB/AGE: 04-23-1981 32 y.o.  Admit date: 08/01/2013 Discharge date: 08/04/2013  Admission Diagnoses: chronic diverticulitis  Discharge Diagnoses:  Active Problems:   Diverticulitis of colon without hemorrhage   Discharged Condition: good  Hospital Course: The patient was admitted after sigmoidectomy.  He tolerated the procedure well.  His diet was advanced to clears on POD 1.  He began to ambulate.  His foley was removed on POD 1 as well.  On POD 2 his diet was advanced to full liquids.  He began to have flatus.  He was switched to PO narcotics.  By POD 3 he was tolerating a diet and having signs of bowel function.  His lab work was stable.  He was discharged to home.  Consults: None  Significant Diagnostic Studies: labs: cbc, chem  Treatments: IV hydration, analgesia: acetaminophen w/ codeine and surgery: lap sigmoidectomy  Discharge Exam: Blood pressure 125/82, pulse 83, temperature 99 F (37.2 C), temperature source Oral, resp. rate 16, height 5\' 11"  (1.803 m), weight 340 lb (154.223 kg), SpO2 96.00%. General appearance: alert and cooperative GI: normal findings: soft, non-tender Incision/Wound: some bruising noted, no erythema, no drainage  Disposition: 01-Home or Self Care   Future Appointments Provider Department Dept Phone   08/31/2013 2:00 PM Romie Levee, MD [facility], [place] [phone]       Medication List    STOP taking these medications       metroNIDAZOLE 500 MG tablet  Commonly known as:  FLAGYL      TAKE these medications       hydrochlorothiazide 25 MG tablet  Commonly known as:  HYDRODIURIL  Take 25 mg by mouth every morning.     oxyCODONE-acetaminophen 5-325 MG per tablet  Commonly known as:  PERCOCET/ROXICET  Take 1-2 tablets by mouth every 4 (four) hours as needed.           Follow-up Information   Follow up with Vanita Panda., MD. (keep apt as  scheduled)    Specialty:  General Surgery   Contact information:   [address], [address] [place] [address] [phone]       Signed: Vanita Panda 08/04/2013, 10:09 AM

## 2013-08-31 ENCOUNTER — Ambulatory Visit (INDEPENDENT_AMBULATORY_CARE_PROVIDER_SITE_OTHER): Payer: 59 | Admitting: General Surgery

## 2013-08-31 ENCOUNTER — Encounter (INDEPENDENT_AMBULATORY_CARE_PROVIDER_SITE_OTHER): Payer: Self-pay | Admitting: General Surgery

## 2013-08-31 VITALS — BP 156/96 | HR 84 | Temp 98.0°F | Resp 14 | Ht 71.0 in | Wt 315.8 lb

## 2013-08-31 DIAGNOSIS — Z9889 Other specified postprocedural states: Secondary | ICD-10-CM

## 2013-08-31 NOTE — Patient Instructions (Signed)
Follow up as needed.  No heavy lifting until 8 weeks after surgery.  I recommend a high fiber diet at this point.

## 2013-08-31 NOTE — Progress Notes (Signed)
Christopher Williamson is a 32 y.o. male who is status post a lap sigmoidectomy on 8/18.  He is feeling better.  He is having some slight incisional pain and fatigue.  These are both getting better.  Having reg BM's  Objective: Filed Vitals:   08/31/13 1415  BP: 156/96  Pulse: 84  Temp: 98 F (36.7 C)  Resp: 14    General appearance: alert and cooperative GI: normal findings: soft, non-tender  Incision: healing well   Assessment: s/p  There are no active problems to display for this patient.   Plan: Doing well.  No heavy lifting for 8 weeks after surgery.  F/U PRN    .Vanita Panda, MD [facility], [place] [phone]   08/31/2013 2:40 PM

## 2014-05-07 IMAGING — CR DG LUMBAR SPINE 2-3V
3 series · 3 of 3 positions shown · non-contrast
Comparison: 01/22/2012.

CLINICAL DATA: Upper lumbar pain.

LUMBAR SPINE - 2-3 VIEW

[t l-spine a.p.]
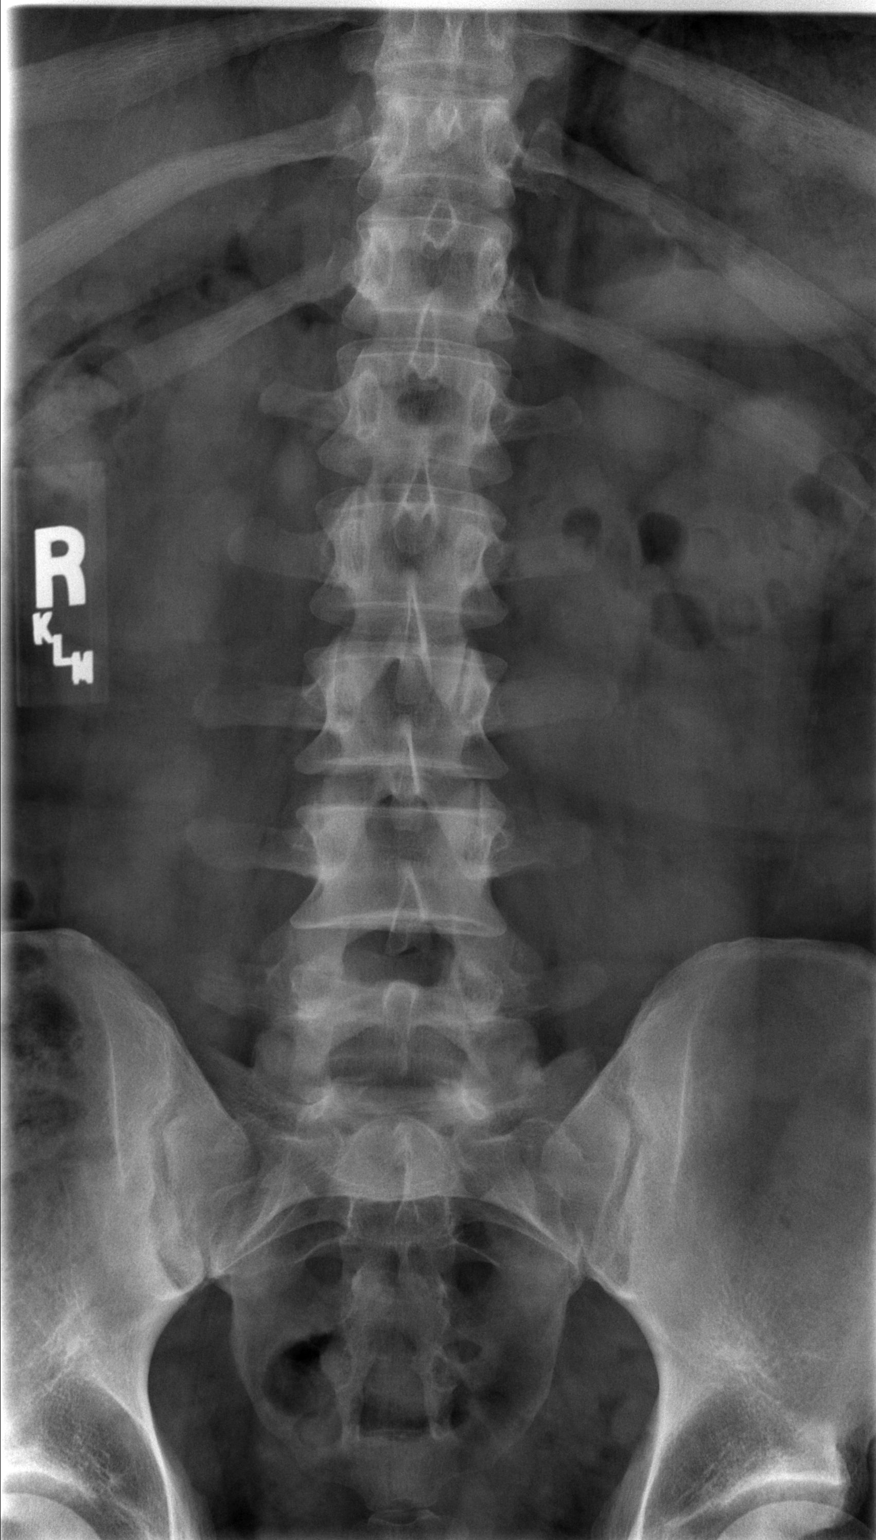

[t l-spine lat]
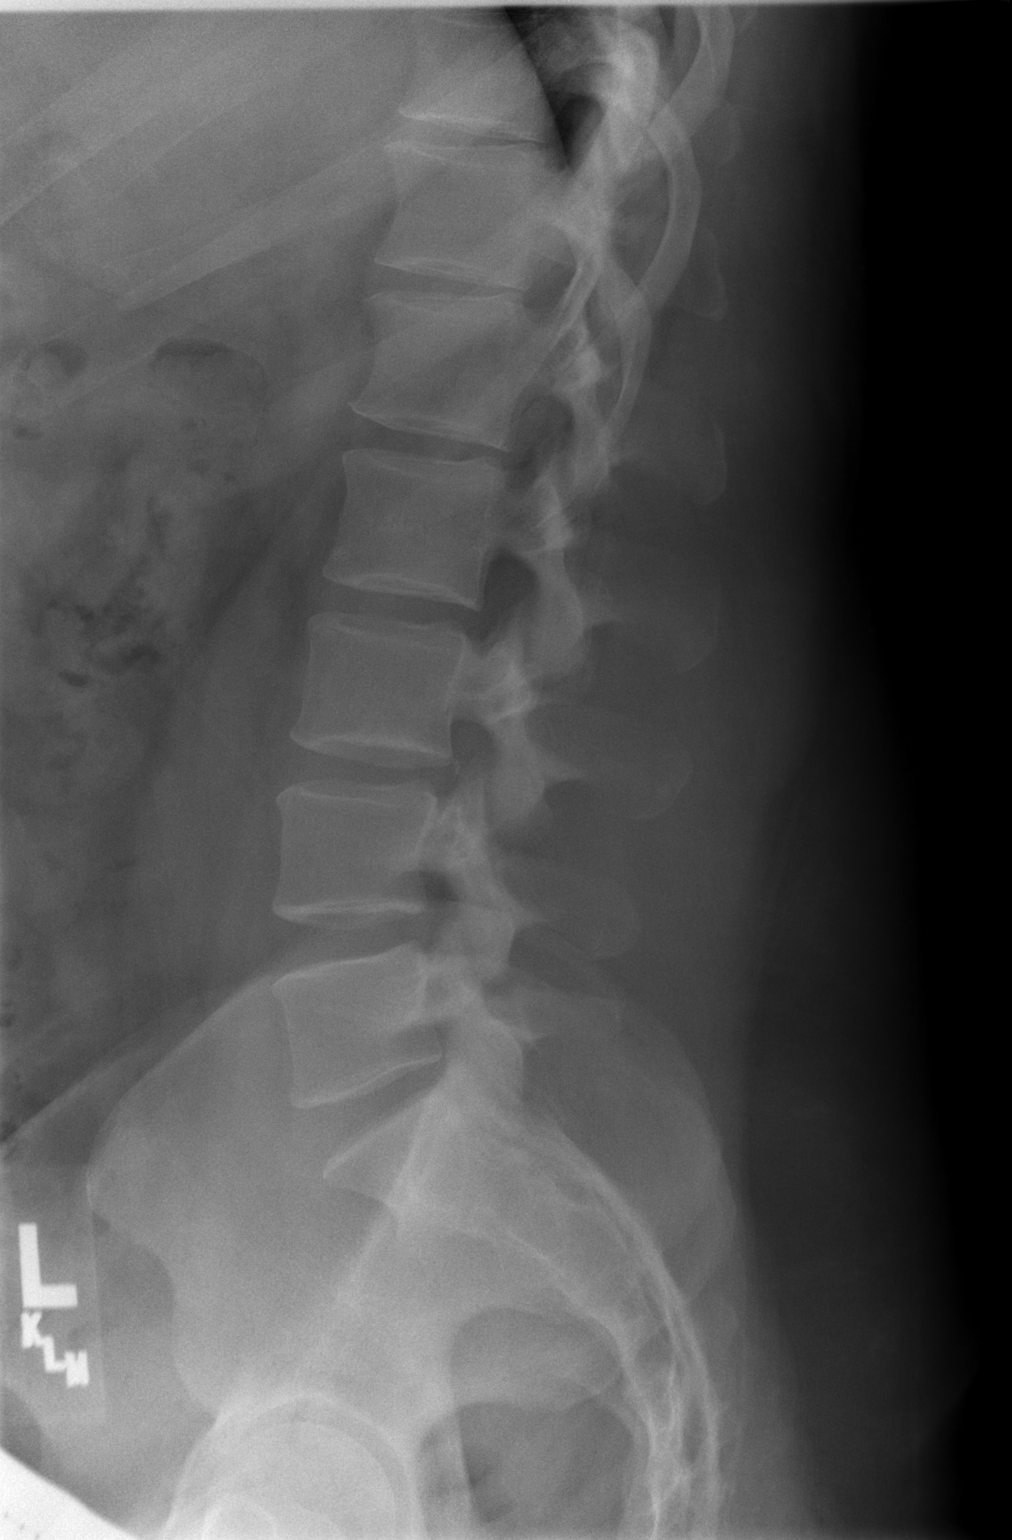

[t l-spine l5-s1 spot]
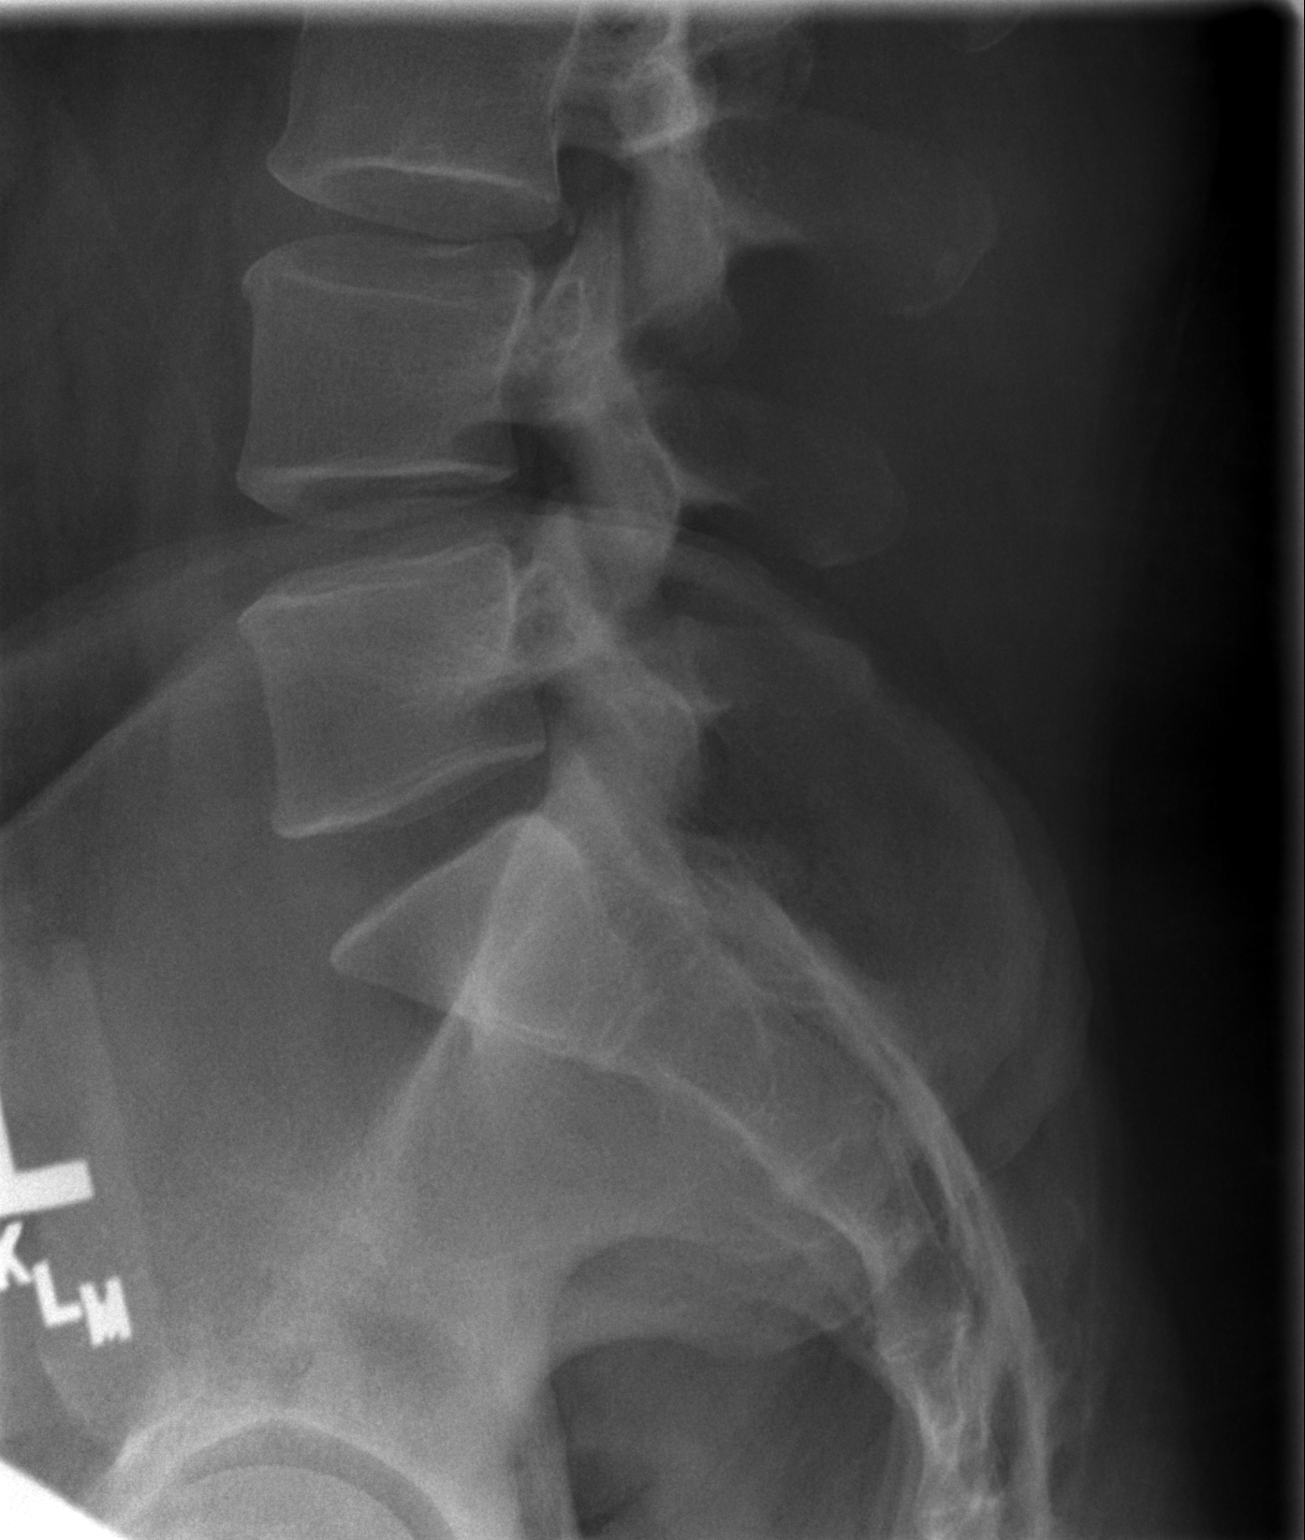

[3 of 3 positions shown; findings below may reference images not displayed]

FINDINGS: Alignment is anatomic.  Vertebral body and disc space
height are maintained.  No significant degenerative changes.
IMPRESSION: No acute findings.

## 2014-05-07 IMAGING — CR DG THORACIC SPINE 3V
4 series · 4 of 4 positions shown · non-contrast
Comparison: None.

CLINICAL DATA: Lower thoracic pain.

THORACIC SPINE - 2 VIEW + SWIMMERS

[t t-spine a.p.]
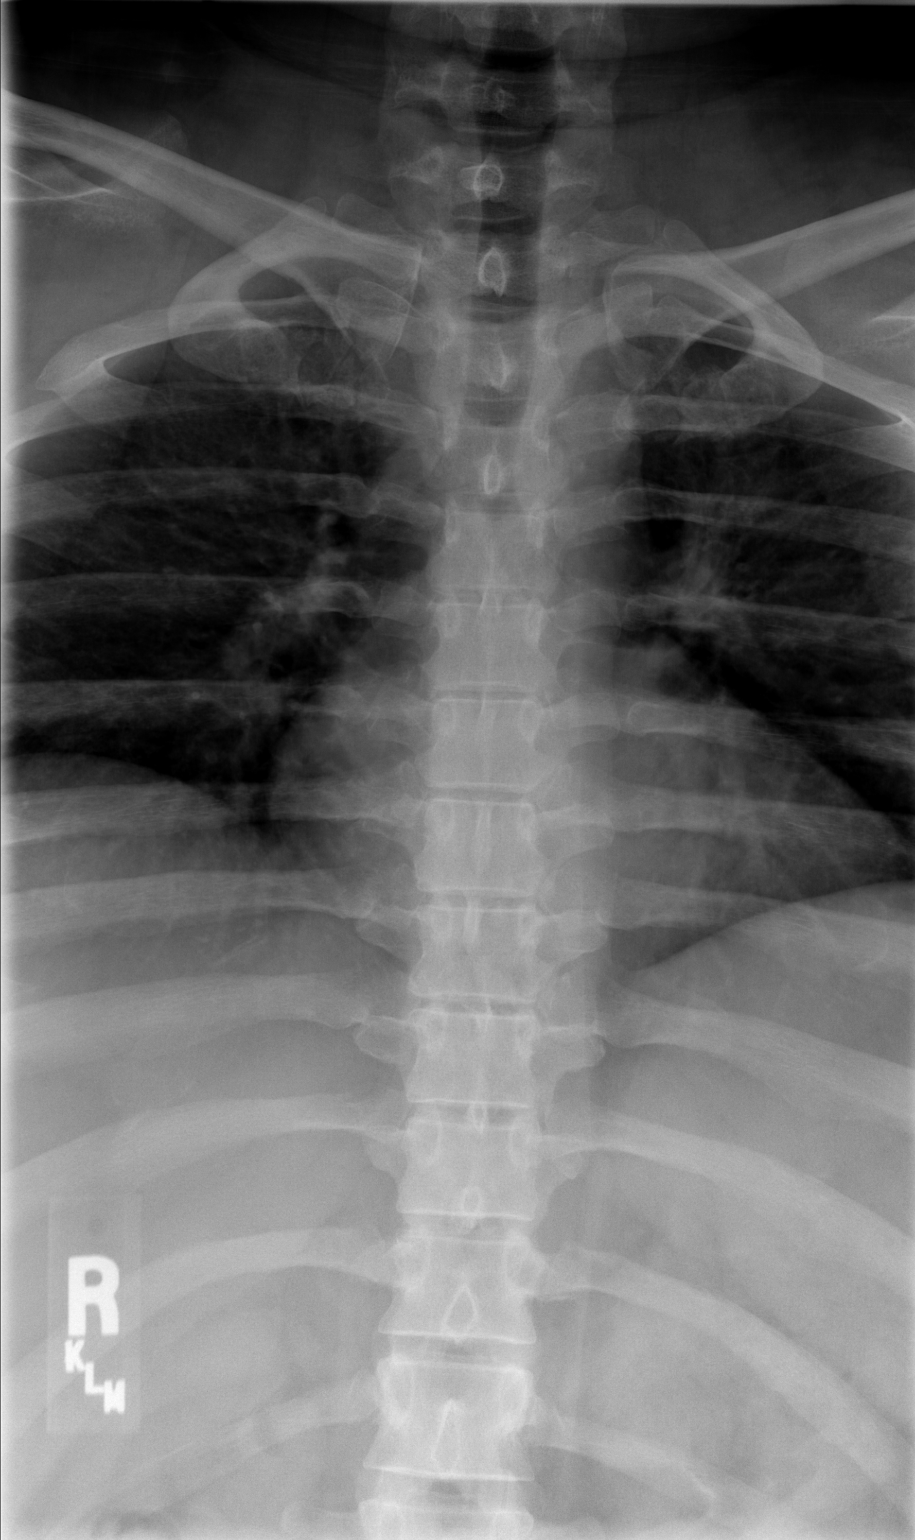

[t t-spine lat]
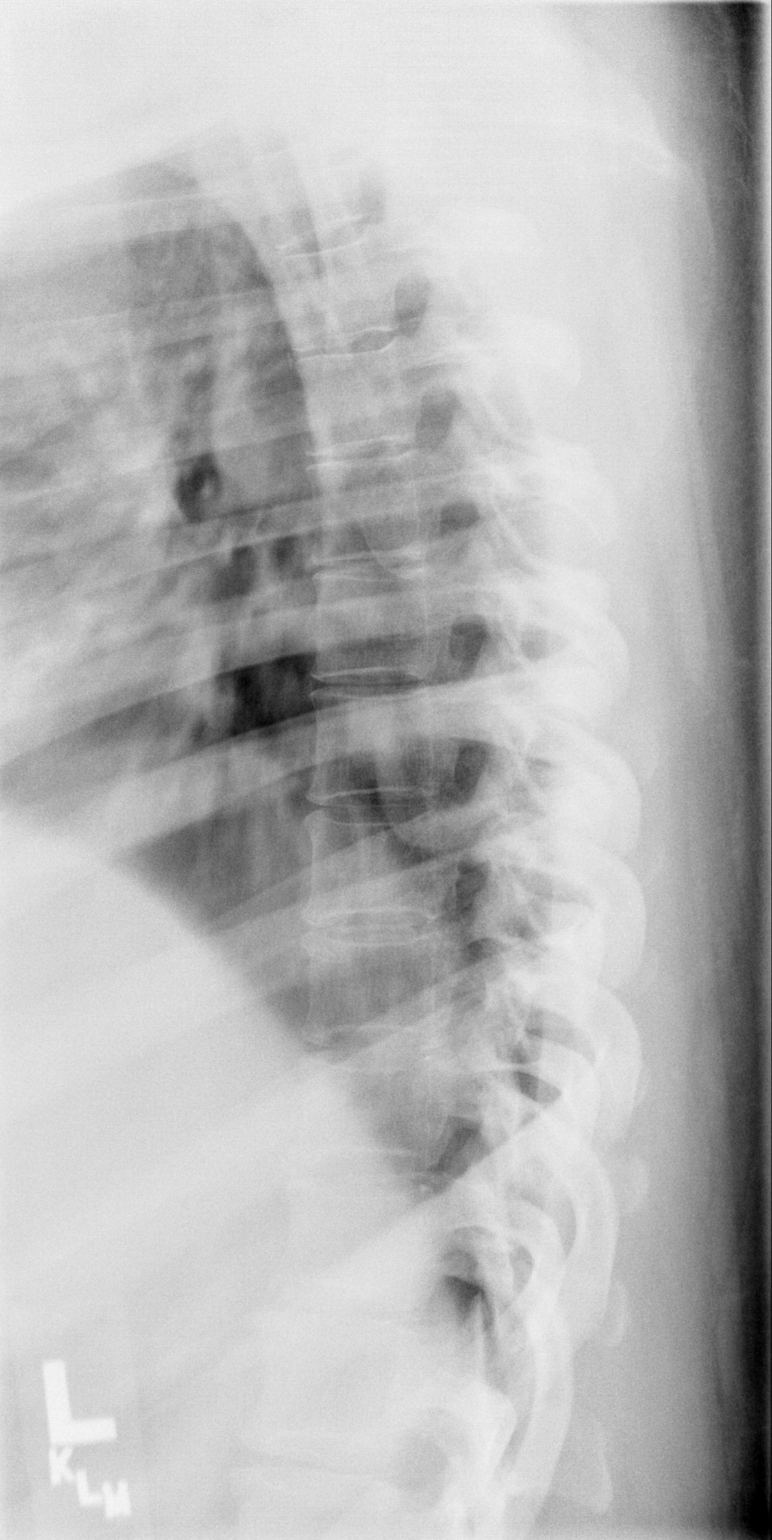

[t t-spine lat *]
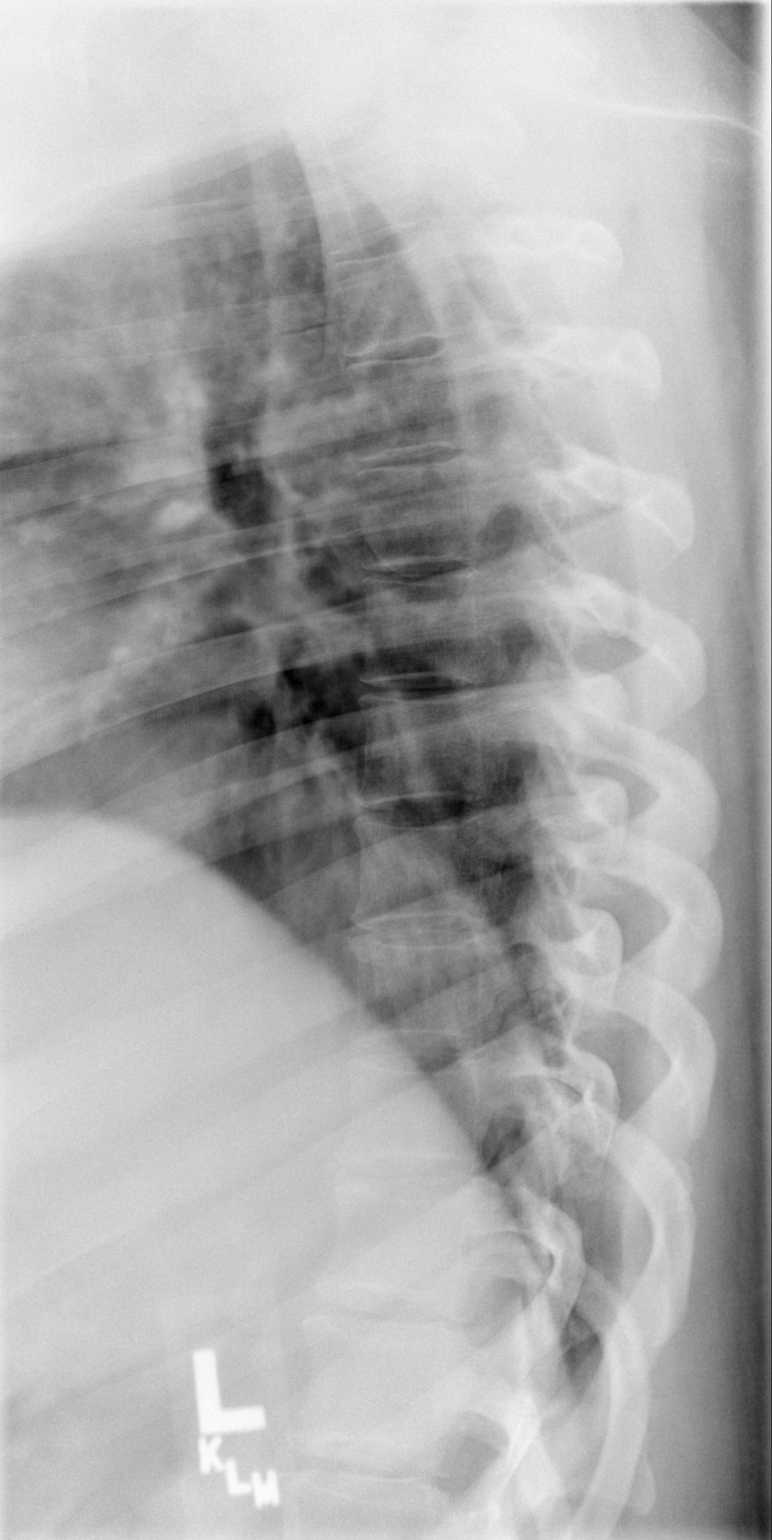

[t swimmers]
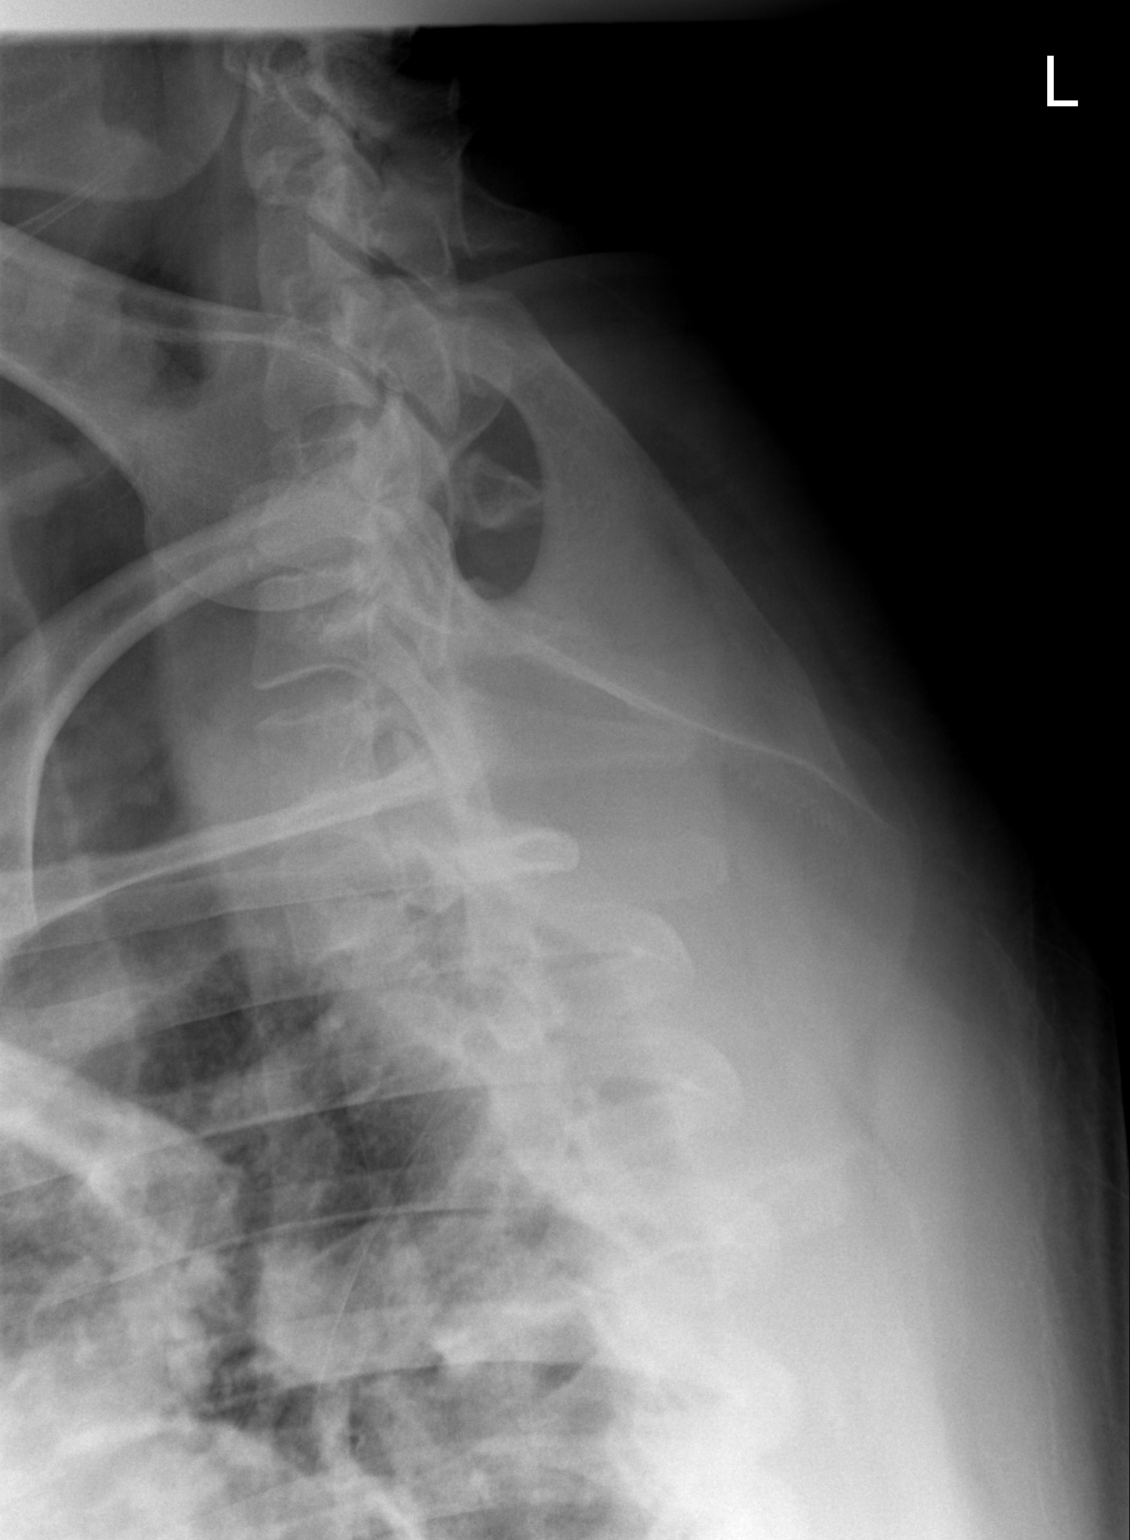

[4 of 4 positions shown; findings below may reference images not displayed]

FINDINGS: Alignment is anatomic.  Vertebral body height is
maintained.  Minimal endplate degenerative change in the lower
thoracic spine.  Cervicothoracic junction is in alignment.
IMPRESSION: No acute findings.  Minimal lower thoracic spondylosis.

## 2015-09-10 ENCOUNTER — Other Ambulatory Visit: Payer: Self-pay | Admitting: Family Medicine

## 2015-09-10 ENCOUNTER — Ambulatory Visit
Admission: RE | Admit: 2015-09-10 | Discharge: 2015-09-10 | Disposition: A | Discharge status: Home / Self Care | Payer: 59 | Source: Ambulatory Visit | Attending: Family Medicine | Admitting: Family Medicine

## 2015-09-10 DIAGNOSIS — M25571 Pain in right ankle and joints of right foot: Secondary | ICD-10-CM

## 2015-09-10 DIAGNOSIS — M25572 Pain in left ankle and joints of left foot: Principal | ICD-10-CM

## 2015-09-26 ENCOUNTER — Other Ambulatory Visit: Payer: Self-pay | Admitting: Orthopedic Surgery

## 2015-09-26 DIAGNOSIS — T148XXA Other injury of unspecified body region, initial encounter: Secondary | ICD-10-CM

## 2015-09-27 ENCOUNTER — Ambulatory Visit
Admission: RE | Admit: 2015-09-27 | Discharge: 2015-09-27 | Disposition: A | Discharge status: Home / Self Care | Payer: 59 | Source: Ambulatory Visit | Attending: Orthopedic Surgery | Admitting: Orthopedic Surgery

## 2015-09-27 DIAGNOSIS — T148XXA Other injury of unspecified body region, initial encounter: Secondary | ICD-10-CM

## 2016-01-22 ENCOUNTER — Emergency Department (HOSPITAL_COMMUNITY)
Admission: EM | Admit: 2016-01-22 | Discharge: 2016-01-22 | Disposition: A | Discharge status: Home / Self Care | Payer: 59 | Attending: Emergency Medicine | Admitting: Emergency Medicine

## 2016-01-22 ENCOUNTER — Emergency Department (HOSPITAL_COMMUNITY): Payer: 59

## 2016-01-22 ENCOUNTER — Encounter (HOSPITAL_COMMUNITY): Payer: Self-pay | Admitting: Family Medicine

## 2016-01-22 DIAGNOSIS — H5711 Ocular pain, right eye: Secondary | ICD-10-CM | POA: Diagnosis present

## 2016-01-22 DIAGNOSIS — Z79899 Other long term (current) drug therapy: Secondary | ICD-10-CM | POA: Insufficient documentation

## 2016-01-22 DIAGNOSIS — Z87891 Personal history of nicotine dependence: Secondary | ICD-10-CM | POA: Diagnosis not present

## 2016-01-22 DIAGNOSIS — I10 Essential (primary) hypertension: Secondary | ICD-10-CM | POA: Diagnosis not present

## 2016-01-22 DIAGNOSIS — Z8659 Personal history of other mental and behavioral disorders: Secondary | ICD-10-CM | POA: Insufficient documentation

## 2016-01-22 DIAGNOSIS — Z8701 Personal history of pneumonia (recurrent): Secondary | ICD-10-CM | POA: Diagnosis not present

## 2016-01-22 DIAGNOSIS — Z8719 Personal history of other diseases of the digestive system: Secondary | ICD-10-CM | POA: Insufficient documentation

## 2016-01-22 DIAGNOSIS — H109 Unspecified conjunctivitis: Secondary | ICD-10-CM

## 2016-01-22 MED ORDER — IOHEXOL 300 MG/ML  SOLN
100.0000 mL | Freq: Once | INTRAMUSCULAR | Status: AC | PRN
  Administered 2016-01-22: 100 mL via INTRAVENOUS

## 2016-01-22 MED ORDER — BACITRACIN-POLYMYXIN B 500-10000 UNIT/GM OP OINT: 1.0000 "application " | TOPICAL_OINTMENT | Freq: Two times a day (BID) | OPHTHALMIC | Status: DC

## 2016-01-22 MED ORDER — ERYTHROMYCIN 5 MG/GM OP OINT: TOPICAL_OINTMENT | OPHTHALMIC | Status: DC

## 2016-01-22 NOTE — Discharge Instructions (Signed)

## 2016-01-22 NOTE — ED Provider Notes (Signed)
CSN: [id number]     Arrival date & time 01/22/16  1641 History  By signing my name below, I, Freida Busman, attest that this documentation has been prepared under the direction and in the presence of non-physician practitioner, Gaylyn Rong, PA-C. Electronically Signed: Freida Busman, Scribe. 01/22/2016. 6:08 PM.    Chief Complaint  Patient presents with  . Eye Pain    The history is provided by the patient. No language interpreter was used.   HPI Comments:  Christopher Williamson is a 35 y.o. male who presents to the Emergency Department complaining of moderate right eye pain, redness, and swelling x 1 week. His pain is exacerbated with movement of the eye. He reports associated photophobia x 1 day and mild blurry vision in the eye. Pt saw PCP ~ 6 days ago for symptom and was given antibiotic drops which he has used without relief. Pt has a h/o pink eye, notes pain today is dissimilar. Denies fever, chills, headache.     Past Medical History  Diagnosis Date  . Diverticulitis   . Hypertension   . Tourette's syndrome   . Seasonal allergies   . Pneumonia     hx of   Past Surgical History  Procedure Laterality Date  . Tonsillectomy    . Wisdom tooth extraction    . Laparoscopic partial colectomy N/A 08/01/2013    Procedure: LAPAROSCOPIC sigmoidectomy with splenic flexure moblization.;  Surgeon: Romie Levee, MD;  Location: [facility];  Service: General;  Laterality: N/A;   History reviewed. No pertinent family history. Social History  Substance Use Topics  . Smoking status: Former Smoker -- 1.00 packs/day for 15 years    Types: Cigarettes    Quit date: 05/17/2013  . Smokeless tobacco: Never Used  . Alcohol Use: 0.0 oz/week     Comment: x 2 days a week    Review of Systems  Constitutional: Negative for fever.  Eyes: Positive for photophobia, pain, redness and visual disturbance.  All other systems reviewed and are negative.   Allergies  Orap and Mushroom extract complex  Home  Medications   Prior to Admission medications   Medication Sig Start Date End Date Taking? Authorizing Provider  hydrochlorothiazide (HYDRODIURIL) 25 MG tablet Take 25 mg by mouth every morning.     Historical Provider, MD   BP 150/92 mmHg  Pulse 83  Temp(Src) 98.1 F (36.7 C)  Resp 20  Ht  (1.803 m)  Wt 330 lb (149.687 kg)  BMI 46.05 kg/m2  SpO2 97% Physical Exam  Constitutional: He is oriented to person, place, and time. He appears well-developed and well-nourished. No distress.  HENT:  Head: Normocephalic and atraumatic.  Eyes: EOM are normal. Pupils are equal, round, and reactive to light. Lids are everted and swept, no foreign bodies found. Right eye exhibits discharge ( clear). Right eye exhibits no hordeolum. No foreign body present in the right eye. Left eye exhibits no discharge and no hordeolum. No foreign body present in the left eye. Right conjunctiva is injected. No scleral icterus.  Slit lamp exam:      The right eye shows no corneal abrasion, no corneal ulcer, no foreign body, no hyphema and no fluorescein uptake.  No fluorescein uptake or corneal abrasions.  IOP 20 mmHg bilaterally Visual acuity: Right 20/40, Left 20/25  Pain felt on the surface of eye with eye movement. No pain behind eye. No lid swelling. No corneal cloudiness.   Cardiovascular: Normal rate.   Pulmonary/Chest: Effort normal.  Neurological:  He is alert and oriented to person, place, and time. Coordination normal.  Skin: Skin is warm and dry. No rash noted. He is not diaphoretic. No erythema. No pallor.  Psychiatric: He has a normal mood and affect. His behavior is normal.  Nursing note and vitals reviewed.   ED Course  Procedures   DIAGNOSTIC STUDIES:  Oxygen Saturation is 97% on RA, normal by my interpretation.    COORDINATION OF CARE:  6:07 PM Will order CT Orbit. Discussed treatment plan with pt at bedside and pt agreed to plan.  Imaging Review Ct Orbits W/cm  01/22/2016   CLINICAL DATA:  Moderate right now pain, redness and swelling for the past week. Photophobia for 1 day. Mild blurred vision in the right eye. The patient reports that the symptoms are similar to previous episodes of conjunctivitis. EXAM: CT ORBITS WITH CONTRAST TECHNIQUE: Multidetector CT imaging of the orbits was performed following the bolus administration of intravenous contrast. CONTRAST:  OMNIPAQUE IOHEXOL 300 MG/ML  SOLN COMPARISON:  None. FINDINGS: The orbital contents and regional bones have normal appearances. No soft tissue swelling or fluid collections identified. No mass is demonstrated. No evidence of sinusitis. IMPRESSION: Normal examination. Electronically Signed   By: Beckie Salts M.D.  On: 01/22/2016 19:27   I have personally reviewed and evaluated these images and lab results as part of my medical decision-making.    MDM   Final diagnoses:  Bacterial conjunctivitis of right eye    Patient presentation consistent with r eye redness, and swelling x 1 week. Associated clear discharge, pain with eye movment and blurry vision. Pt has failed outpt abx eye drops given to him by PCP 6 days ago. CT orbit obtained to r/o orbital cellulitis: negative. No evidence of corneal abrasions, no fluorescein uptake, entrapment,  or herpes keratitis.  Presentation not concerning for iritis, or corneal abrasions. Suspect worsening bacterial conjunctivitis. Pt discharged with polysporin and erythromycin opthalmic ointments. Personal hygiene and frequent handwashing discussed.  Patient advised to follow up with ophthalmologist. Return precautions discussed.  Patient verbalizes understanding and is agreeable with discharge.   I personally performed the services described in this documentation, which was scribed in my presence. The recorded information has been reviewed and is accurate.     Lester Kinsman [place], PA-C 01/23/16 1451  Lorre Nick, MD 01/24/16 [phone]

## 2016-01-22 NOTE — ED Notes (Signed)
Pt complains of right eye pain, redness, swelling, sensitive to light, and having drainage. Pt seen PCP last Thursday for symptoms, given eye drop antibiotic with no relief.

## 2016-12-17 DIAGNOSIS — M958 Other specified acquired deformities of musculoskeletal system: Secondary | ICD-10-CM | POA: Diagnosis not present

## 2016-12-17 DIAGNOSIS — M94272 Chondromalacia, left ankle and joints of left foot: Secondary | ICD-10-CM | POA: Diagnosis not present

## 2016-12-17 DIAGNOSIS — M659 Synovitis and tenosynovitis, unspecified: Secondary | ICD-10-CM | POA: Diagnosis not present

## 2016-12-19 HISTORY — PX: ANKLE ARTHROSCOPY: SUR85

## 2016-12-23 DIAGNOSIS — M25572 Pain in left ankle and joints of left foot: Secondary | ICD-10-CM | POA: Diagnosis not present

## 2017-01-10 ENCOUNTER — Encounter (HOSPITAL_COMMUNITY): Payer: Self-pay

## 2017-01-10 ENCOUNTER — Emergency Department (HOSPITAL_COMMUNITY)
Admission: EM | Admit: 2017-01-10 | Discharge: 2017-01-10 | Disposition: A | Discharge status: Home / Self Care | Payer: 59 | Attending: Emergency Medicine | Admitting: Emergency Medicine

## 2017-01-10 DIAGNOSIS — K047 Periapical abscess without sinus: Secondary | ICD-10-CM

## 2017-01-10 DIAGNOSIS — K0889 Other specified disorders of teeth and supporting structures: Secondary | ICD-10-CM | POA: Diagnosis present

## 2017-01-10 DIAGNOSIS — S025XXA Fracture of tooth (traumatic), initial encounter for closed fracture: Secondary | ICD-10-CM

## 2017-01-10 DIAGNOSIS — Z79899 Other long term (current) drug therapy: Secondary | ICD-10-CM | POA: Insufficient documentation

## 2017-01-10 DIAGNOSIS — Z87891 Personal history of nicotine dependence: Secondary | ICD-10-CM | POA: Diagnosis not present

## 2017-01-10 DIAGNOSIS — I10 Essential (primary) hypertension: Secondary | ICD-10-CM | POA: Insufficient documentation

## 2017-01-10 DIAGNOSIS — K0381 Cracked tooth: Secondary | ICD-10-CM | POA: Diagnosis not present

## 2017-01-10 MED ORDER — CLINDAMYCIN HCL 300 MG PO CAPS: 300.0000 mg | ORAL_CAPSULE | Freq: Three times a day (TID) | ORAL | 0 refills | Status: DC

## 2017-01-10 MED ORDER — CLINDAMYCIN HCL 150 MG PO CAPS
300.0000 mg | ORAL_CAPSULE | Freq: Once | ORAL | Status: AC
  Administered 2017-01-10: 300 mg via ORAL
  Filled 2017-01-10: qty 2

## 2017-01-10 NOTE — Discharge Instructions (Signed)
Follow up with the dentist tomorrow as planned. Take the antibiotic as directed. Take ibuprofen as needed.

## 2017-01-10 NOTE — ED Triage Notes (Signed)
Pt has a left lower molar that is cracked.  Onset several days toothache.  Pt tried OTC meds with no relief.  Pain got severe pt started having left arm numbness.  Pt took a Percocet prior to coming to ED and now pain has decreased and no left arm numbness at this time.

## 2017-01-10 NOTE — ED Provider Notes (Signed)
[facility]-EMERGENCY DEPT Provider Note   CSN: [id number] Arrival date & time: 01/10/17  1949    By signing my name below, I, Valentino Saxon, attest that this documentation has been prepared under the direction and in the presence of Kerrie Buffalo, NP. Electronically Signed: Valentino Saxon, ED Scribe. 01/10/17. 9:41 PM.  History   Chief Complaint Chief Complaint  Patient presents with  . Dental Pain  . Numbness   The history is provided by the patient. No language interpreter was used.    HPI Comments: Christopher Williamson is a 35 y.o. male who presents to the Emergency Department for left lower dental pain x several days. Pt notes he has a "bad tooth that is cracked" with swelling around his gums. Pain described as unbearbale, worse with chewing on affected side. He notes his pain radiates down to his left arm. Pt reports associated numbness. No fevers, sweats, or chills. Taken OTC pain meds without significant relief. He reports taking prescribed percocet with no relief. Pt not currently established with a dentist. He notes he will f/u with an emergency dentist, [facility], in [place] tomorrow. Denies CP, nausea and vomiting.   Past Medical History:  Diagnosis Date  . Diverticulitis   . Hypertension   . Pneumonia    hx of  . Seasonal allergies   . Tourette's syndrome     There are no active problems to display for this patient.   Past Surgical History:  Procedure Laterality Date  . ANKLE ARTHROSCOPY Left 12/19/2016  . LAPAROSCOPIC PARTIAL COLECTOMY N/A 08/01/2013   Procedure: LAPAROSCOPIC sigmoidectomy with splenic flexure moblization.;  Surgeon: Romie Levee, MD;  Location: [facility];  Service: General;  Laterality: N/A;  . TONSILLECTOMY    . WISDOM TOOTH EXTRACTION         Home Medications    Prior to Admission medications   Medication Sig Start Date End Date Taking? Authorizing Provider  bacitracin-polymyxin b (POLYSPORIN) ophthalmic ointment Place 1  application into the right eye every 12 (twelve) hours. apply to eye every 12 hours while awake 01/22/16   Samantha Tripp Dowless, PA-C  clindamycin (CLEOCIN) 300 MG capsule Take 1 capsule (300 mg total) by mouth 3 (three) times daily. 01/10/17   Blayne Frankie Orlene Och, NP  erythromycin ophthalmic ointment Place a 1/2 inch ribbon of ointment into the lower eyelid. 01/22/16   Samantha Tripp Dowless, PA-C  hydrochlorothiazide (HYDRODIURIL) 25 MG tablet Take 25 mg by mouth every morning.     Historical Provider, MD    Family History History reviewed. No pertinent family history.  Social History Social History  Substance Use Topics  . Smoking status: Former Smoker    Packs/day: 1.00    Years: 15.00    Types: Cigarettes    Quit date: 05/17/2013  . Smokeless tobacco: Never Used  . Alcohol use No     Allergies   Orap [pimozide] and Mushroom extract complex   Review of Systems Review of Systems  Constitutional: Negative for chills and fever.  HENT: Positive for dental problem (left lower) and facial swelling.   Respiratory: Negative for shortness of breath.   Cardiovascular: Negative for chest pain.  Gastrointestinal: Negative for nausea and vomiting.  All other systems reviewed and are negative.    Physical Exam Updated Vital Signs BP 141/81 (BP Location: Right Arm)   Pulse 82   Temp 98 F (36.7 C) (Oral)   Resp 18   Ht 5\' 11"  (1.803 m)   Wt (!) 155.6  kg   SpO2 97%   BMI 47.85 kg/m   Physical Exam  Constitutional: He appears well-developed and well-nourished.  HENT:  Head: Normocephalic and atraumatic.  Left lower First molar is broke. There is swelling and erythema to gum surrounding tooth. The second molar is decayed to the gumline.   Eyes: Conjunctivae are normal. Right eye exhibits no discharge. Left eye exhibits no discharge.  Cardiovascular: Normal rate.   Pulmonary/Chest: Effort normal. No respiratory distress.  Musculoskeletal: Normal range of motion.  Neurological: He is  alert.  Skin: Skin is warm and dry. No rash noted. He is not diaphoretic. No erythema.  Psychiatric: He has a normal mood and affect. His behavior is normal.  Nursing note and vitals reviewed.    ED Treatments / Results   DIAGNOSTIC STUDIES: Oxygen Saturation is 99% on RA, normal by my interpretation.    COORDINATION OF CARE: 9:28 PM Discussed treatment plan with pt at bedside which includes antibiotics and pt agreed to plan. Procedures  Dental Block Date/Time: 01/10/2017 9:56 PM Performed by: Janne NapoleonNEESE, Mareesa Gathright M Authorized by: Rolland PorterJAMES, MARK   Consent:    Consent obtained:  Verbal   Consent given by:  Patient   Risks discussed:  Allergic reaction and unsuccessful block   Alternatives discussed:  No treatment Indications:    Indications: dental pain   Location:    Block type:  Inferior alveolar   Laterality:  Left Procedure details (see MAR for exact dosages):    Topical anesthetic:  Benzocaine gel   Syringe type:  Aspirating dental syringe   Needle gauge:  27 G   Anesthetic injected:  Bupivacaine 0.5% w/o epi   Injection procedure:  Anatomic landmarks identified Post-procedure details:    Outcome:  Anesthesia achieved   Patient tolerance of procedure:  Tolerated well, no immediate complications    (including critical care time)  Medications Ordered in ED Medications  clindamycin (CLEOCIN) capsule 300 mg (300 mg Oral Given 01/10/17 2143)     Initial Impression / Assessment and Plan / ED Course  I have reviewed the triage vital signs and the nursing notes.   Patient with dentalgia.  No abscess requiring immediate incision and drainage.  Exam not concerning for Ludwig's angina or pharyngeal abscess.  Will treat with dental block. Pt instructed to follow-up with dentist. He is going to the emergency clinic in the am. Discussed return precautions. Pt safe for discharge.  Final Clinical Impressions(s) / ED Diagnoses   Final diagnoses:  Dental infection  Closed fracture of  tooth, initial encounter    New Prescriptions Discharge Medication List as of 01/10/2017  9:57 PM    START taking these medications   Details  clindamycin (CLEOCIN) 300 MG capsule Take 1 capsule (300 mg total) by mouth 3 (three) times daily., Starting Sat 01/10/2017, Print        I personally performed the services described in this documentation, which was scribed in my presence. The recorded information has been reviewed and is accurate.     [address]Hope OrtleyM Shironda Kain, NP 01/10/17 2249    Rolland PorterMark James, MD 01/23/17 [phone]

## 2019-07-06 DIAGNOSIS — J069 Acute upper respiratory infection, unspecified: Secondary | ICD-10-CM | POA: Diagnosis not present

## 2019-07-06 DIAGNOSIS — R6883 Chills (without fever): Secondary | ICD-10-CM | POA: Diagnosis not present

## 2019-09-17 DIAGNOSIS — K5792 Diverticulitis of intestine, part unspecified, without perforation or abscess without bleeding: Secondary | ICD-10-CM | POA: Diagnosis not present

## 2019-09-17 DIAGNOSIS — R1032 Left lower quadrant pain: Secondary | ICD-10-CM | POA: Diagnosis not present

## 2019-09-18 ENCOUNTER — Emergency Department (HOSPITAL_COMMUNITY): Payer: BC Managed Care – PPO

## 2019-09-18 ENCOUNTER — Other Ambulatory Visit: Payer: Self-pay

## 2019-09-18 ENCOUNTER — Inpatient Hospital Stay (HOSPITAL_COMMUNITY)
Admission: EM | Admit: 2019-09-18 | Discharge: 2019-09-21 | DRG: 392 | Disposition: A | Discharge status: Home / Self Care | Payer: BC Managed Care – PPO | Attending: Orthopaedic Surgery | Admitting: Orthopaedic Surgery

## 2019-09-18 ENCOUNTER — Encounter (HOSPITAL_COMMUNITY): Payer: Self-pay | Admitting: *Deleted

## 2019-09-18 DIAGNOSIS — Z9049 Acquired absence of other specified parts of digestive tract: Secondary | ICD-10-CM | POA: Diagnosis not present

## 2019-09-18 DIAGNOSIS — E669 Obesity, unspecified: Secondary | ICD-10-CM | POA: Diagnosis present

## 2019-09-18 DIAGNOSIS — I10 Essential (primary) hypertension: Secondary | ICD-10-CM | POA: Diagnosis not present

## 2019-09-18 DIAGNOSIS — Z20828 Contact with and (suspected) exposure to other viral communicable diseases: Secondary | ICD-10-CM | POA: Diagnosis not present

## 2019-09-18 DIAGNOSIS — K5732 Diverticulitis of large intestine without perforation or abscess without bleeding: Secondary | ICD-10-CM | POA: Diagnosis not present

## 2019-09-18 DIAGNOSIS — K5720 Diverticulitis of large intestine with perforation and abscess without bleeding: Secondary | ICD-10-CM | POA: Diagnosis not present

## 2019-09-18 DIAGNOSIS — Z91018 Allergy to other foods: Secondary | ICD-10-CM

## 2019-09-18 DIAGNOSIS — K5792 Diverticulitis of intestine, part unspecified, without perforation or abscess without bleeding: Secondary | ICD-10-CM | POA: Diagnosis not present

## 2019-09-18 DIAGNOSIS — Z03818 Encounter for observation for suspected exposure to other biological agents ruled out: Secondary | ICD-10-CM | POA: Diagnosis not present

## 2019-09-18 DIAGNOSIS — F952 Tourette's disorder: Secondary | ICD-10-CM | POA: Diagnosis present

## 2019-09-18 DIAGNOSIS — G4733 Obstructive sleep apnea (adult) (pediatric): Secondary | ICD-10-CM | POA: Diagnosis not present

## 2019-09-18 DIAGNOSIS — A419 Sepsis, unspecified organism: Secondary | ICD-10-CM | POA: Diagnosis not present

## 2019-09-18 DIAGNOSIS — Z87891 Personal history of nicotine dependence: Secondary | ICD-10-CM

## 2019-09-18 DIAGNOSIS — Z888 Allergy status to other drugs, medicaments and biological substances status: Secondary | ICD-10-CM | POA: Diagnosis not present

## 2019-09-18 DIAGNOSIS — J302 Other seasonal allergic rhinitis: Secondary | ICD-10-CM | POA: Diagnosis present

## 2019-09-18 DIAGNOSIS — Z6841 Body Mass Index (BMI) 40.0 and over, adult: Secondary | ICD-10-CM | POA: Diagnosis not present

## 2019-09-18 DIAGNOSIS — R1032 Left lower quadrant pain: Secondary | ICD-10-CM | POA: Diagnosis not present

## 2019-09-18 DIAGNOSIS — N200 Calculus of kidney: Secondary | ICD-10-CM | POA: Diagnosis not present

## 2019-09-18 DIAGNOSIS — R509 Fever, unspecified: Secondary | ICD-10-CM | POA: Diagnosis not present

## 2019-09-18 LAB — CBC WITH DIFFERENTIAL/PLATELET
Abs Immature Granulocytes: 0.17 10*3/uL — ABNORMAL HIGH (ref 0.00–0.07)
Basophils Absolute: 0.1 10*3/uL (ref 0.0–0.1)
Basophils Relative: 0 %
Eosinophils Absolute: 0 10*3/uL (ref 0.0–0.5)
Eosinophils Relative: 0 %
HCT: 46.8 % (ref 39.0–52.0)
Hemoglobin: 15.5 g/dL (ref 13.0–17.0)
Immature Granulocytes: 1 %
Lymphocytes Relative: 13 %
Lymphs Abs: 3.2 10*3/uL (ref 0.7–4.0)
MCH: 29.5 pg (ref 26.0–34.0)
MCHC: 33.1 g/dL (ref 30.0–36.0)
MCV: 89.1 fL (ref 80.0–100.0)
Monocytes Absolute: 3.1 10*3/uL — ABNORMAL HIGH (ref 0.1–1.0)
Monocytes Relative: 13 %
Neutro Abs: 17.5 10*3/uL — ABNORMAL HIGH (ref 1.7–7.7)
Neutrophils Relative %: 73 %
Platelets: 273 10*3/uL (ref 150–400)
RBC: 5.25 MIL/uL (ref 4.22–5.81)
RDW: 13.4 % (ref 11.5–15.5)
WBC: 24 10*3/uL — ABNORMAL HIGH (ref 4.0–10.5)
nRBC: 0 % (ref 0.0–0.2)

## 2019-09-18 LAB — COMPREHENSIVE METABOLIC PANEL
ALT: 30 U/L (ref 0–44)
AST: 21 U/L (ref 15–41)
Albumin: 3.4 g/dL — ABNORMAL LOW (ref 3.5–5.0)
Alkaline Phosphatase: 39 U/L (ref 38–126)
Anion gap: 12 (ref 5–15)
BUN: 12 mg/dL (ref 6–20)
CO2: 25 mmol/L (ref 22–32)
Calcium: 8.8 mg/dL — ABNORMAL LOW (ref 8.9–10.3)
Chloride: 97 mmol/L — ABNORMAL LOW (ref 98–111)
Creatinine, Ser: 1.3 mg/dL — ABNORMAL HIGH (ref 0.61–1.24)
GFR calc Af Amer: >60 mL/min (ref >60)
GFR calc non Af Amer: >60 mL/min (ref >60)
Glucose, Bld: 110 mg/dL — ABNORMAL HIGH (ref 70–99)
Potassium: 4 mmol/L (ref 3.5–5.1)
Sodium: 134 mmol/L — ABNORMAL LOW (ref 135–145)
Total Bilirubin: 1.3 mg/dL — ABNORMAL HIGH (ref 0.3–1.2)
Total Protein: 7.1 g/dL (ref 6.5–8.1)

## 2019-09-18 LAB — CBC
HCT: 43.4 % (ref 39.0–52.0)
Hemoglobin: 14.3 g/dL (ref 13.0–17.0)
MCH: 29.7 pg (ref 26.0–34.0)
MCHC: 32.9 g/dL (ref 30.0–36.0)
MCV: 90 fL (ref 80.0–100.0)
Platelets: 247 10*3/uL (ref 150–400)
RBC: 4.82 MIL/uL (ref 4.22–5.81)
RDW: 13.4 % (ref 11.5–15.5)
WBC: 22.3 10*3/uL — ABNORMAL HIGH (ref 4.0–10.5)
nRBC: 0 % (ref 0.0–0.2)

## 2019-09-18 LAB — URINALYSIS, ROUTINE W REFLEX MICROSCOPIC
Bacteria, UA: NONE SEEN
Bilirubin Urine: NEGATIVE
Glucose, UA: NEGATIVE mg/dL
Hgb urine dipstick: NEGATIVE
Ketones, ur: NEGATIVE mg/dL
Nitrite: NEGATIVE
Protein, ur: NEGATIVE mg/dL
Specific Gravity, Urine: >1.046 — ABNORMAL HIGH (ref 1.005–1.030)
pH: 6 (ref 5.0–8.0)

## 2019-09-18 LAB — HIV ANTIBODY (ROUTINE TESTING W REFLEX): HIV Screen 4th Generation wRfx: NONREACTIVE

## 2019-09-18 LAB — PROTIME-INR
INR: 1.5 — ABNORMAL HIGH (ref 0.8–1.2)
Prothrombin Time: 18.2 seconds — ABNORMAL HIGH (ref 11.4–15.2)

## 2019-09-18 LAB — CREATININE, SERUM
Creatinine, Ser: 1.14 mg/dL (ref 0.61–1.24)
GFR calc Af Amer: >60 mL/min (ref >60)
GFR calc non Af Amer: >60 mL/min (ref >60)

## 2019-09-18 LAB — LACTIC ACID, PLASMA
Lactic Acid, Venous: 1 mmol/L (ref 0.5–1.9)
Lactic Acid, Venous: 0.7 mmol/L (ref 0.5–1.9)

## 2019-09-18 MED ORDER — OXYCODONE HCL 5 MG PO TABS
5.0000 mg | ORAL_TABLET | ORAL | Status: DC | PRN
  Administered 2019-09-19 (×2): 10 mg via ORAL
  Filled 2019-09-18 (×2): qty 2

## 2019-09-18 MED ORDER — METHOCARBAMOL 500 MG PO TABS: 500.0000 mg | ORAL_TABLET | Freq: Four times a day (QID) | ORAL | Status: DC | PRN

## 2019-09-18 MED ORDER — DIPHENHYDRAMINE HCL 50 MG/ML IJ SOLN: 25.0000 mg | Freq: Four times a day (QID) | INTRAMUSCULAR | Status: DC | PRN

## 2019-09-18 MED ORDER — LISINOPRIL 10 MG PO TABS
10.0000 mg | ORAL_TABLET | Freq: Every day | ORAL | Status: DC
  Administered 2019-09-18 – 2019-09-21 (×4): 10 mg via ORAL
  Filled 2019-09-18 (×4): qty 1

## 2019-09-18 MED ORDER — ONDANSETRON HCL 4 MG/2ML IJ SOLN
4.0000 mg | Freq: Four times a day (QID) | INTRAMUSCULAR | Status: DC | PRN
  Administered 2019-09-19: 4 mg via INTRAVENOUS
  Filled 2019-09-18: qty 2

## 2019-09-18 MED ORDER — MORPHINE SULFATE (PF) 4 MG/ML IV SOLN
4.0000 mg | Freq: Once | INTRAVENOUS | Status: AC
  Administered 2019-09-18: 4 mg via INTRAVENOUS
  Filled 2019-09-18: qty 1

## 2019-09-18 MED ORDER — ACETAMINOPHEN 325 MG PO TABS
650.0000 mg | ORAL_TABLET | Freq: Once | ORAL | Status: AC | PRN
  Administered 2019-09-18: 650 mg via ORAL
  Filled 2019-09-18: qty 2

## 2019-09-18 MED ORDER — PIPERACILLIN-TAZOBACTAM 3.375 G IVPB
3.3750 g | Freq: Three times a day (TID) | INTRAVENOUS | Status: DC
  Administered 2019-09-18 – 2019-09-21 (×8): 3.375 g via INTRAVENOUS
  Filled 2019-09-18 (×7): qty 50

## 2019-09-18 MED ORDER — POTASSIUM CHLORIDE IN NACL 20-0.45 MEQ/L-% IV SOLN
INTRAVENOUS | Status: DC
  Administered 2019-09-18 – 2019-09-21 (×4): via INTRAVENOUS
  Filled 2019-09-18 (×6): qty 1000

## 2019-09-18 MED ORDER — PANTOPRAZOLE SODIUM 40 MG IV SOLR
40.0000 mg | Freq: Every day | INTRAVENOUS | Status: DC
  Administered 2019-09-18 – 2019-09-20 (×3): 40 mg via INTRAVENOUS
  Filled 2019-09-18 (×3): qty 40

## 2019-09-18 MED ORDER — ONDANSETRON 4 MG PO TBDP: 4.0000 mg | ORAL_TABLET | Freq: Four times a day (QID) | ORAL | Status: DC | PRN

## 2019-09-18 MED ORDER — AMLODIPINE BESYLATE 5 MG PO TABS
5.0000 mg | ORAL_TABLET | Freq: Every day | ORAL | Status: DC
  Administered 2019-09-18 – 2019-09-21 (×4): 5 mg via ORAL
  Filled 2019-09-18 (×4): qty 1

## 2019-09-18 MED ORDER — IOHEXOL 300 MG/ML  SOLN
100.0000 mL | Freq: Once | INTRAMUSCULAR | Status: AC | PRN
  Administered 2019-09-18: 100 mL via INTRAVENOUS

## 2019-09-18 MED ORDER — CIPROFLOXACIN IN D5W 400 MG/200ML IV SOLN
400.0000 mg | Freq: Once | INTRAVENOUS | Status: AC
  Administered 2019-09-18: 400 mg via INTRAVENOUS
  Filled 2019-09-18: qty 200

## 2019-09-18 MED ORDER — ACETAMINOPHEN 650 MG RE SUPP: 650.0000 mg | Freq: Four times a day (QID) | RECTAL | Status: DC | PRN

## 2019-09-18 MED ORDER — ACETAMINOPHEN 325 MG PO TABS
650.0000 mg | ORAL_TABLET | Freq: Four times a day (QID) | ORAL | Status: DC | PRN
  Administered 2019-09-18 – 2019-09-19 (×2): 650 mg via ORAL
  Filled 2019-09-18 (×2): qty 2

## 2019-09-18 MED ORDER — SODIUM CHLORIDE 0.9 % IV BOLUS
1000.0000 mL | Freq: Once | INTRAVENOUS | Status: AC
  Administered 2019-09-18: 1000 mL via INTRAVENOUS

## 2019-09-18 MED ORDER — DIPHENHYDRAMINE HCL 25 MG PO CAPS: 25.0000 mg | ORAL_CAPSULE | Freq: Four times a day (QID) | ORAL | Status: DC | PRN

## 2019-09-18 MED ORDER — ENOXAPARIN SODIUM 40 MG/0.4ML ~~LOC~~ SOLN
40.0000 mg | SUBCUTANEOUS | Status: DC
  Filled 2019-09-18 (×2): qty 0.4

## 2019-09-18 MED ORDER — MORPHINE SULFATE (PF) 4 MG/ML IV SOLN
4.0000 mg | INTRAVENOUS | Status: DC | PRN
  Administered 2019-09-18 – 2019-09-19 (×3): 4 mg via INTRAVENOUS
  Filled 2019-09-18 (×3): qty 1

## 2019-09-18 MED ORDER — METRONIDAZOLE IN NACL 5-0.79 MG/ML-% IV SOLN
500.0000 mg | Freq: Once | INTRAVENOUS | Status: AC
  Administered 2019-09-18: 500 mg via INTRAVENOUS
  Filled 2019-09-18: qty 100

## 2019-09-18 NOTE — ED Notes (Signed)
Patient transported to X-ray 

## 2019-09-18 NOTE — ED Notes (Signed)
Report given to 6 N RN. All questions answered 

## 2019-09-18 NOTE — ED Triage Notes (Signed)
Pt reporting left lower abdominal pain "feels a lot like when I had diverticulitis", c/o nausea, constipation and fevers. Hx of partial colectomy with last bout of diverticulitis.

## 2019-09-18 NOTE — ED Provider Notes (Signed)
[facility] EMERGENCY DEPARTMENT Provider Note   CSN: [id number] Arrival date & time: 09/18/19  1607     History   Chief Complaint Chief Complaint  Patient presents with  . Abdominal Pain    HPI Christopher Williamson is a 38 y.o. male.     38 year old male with prior medical history as detailed below presents for evaluation of left lower quadrant abdominal pain.  Patient reports prior history of diverticulitis requiring surgical intervention.  He reports that his symptoms today are similar to that prior episode of diverticulitis.  He reports gradual onset of left lower quadrant abdominal pain over the last week.  He reports fever over the last 24 hours.  He initiated outpatient Cipro and Flagyl yesterday.  He reports mild nausea associated with his pain.  He denies chest pain or shortness of breath.  The history is provided by the patient and medical records.  Abdominal Pain Pain location:  LLQ Pain quality: aching and cramping   Pain radiates to:  Does not radiate Pain severity:  Moderate Onset quality:  Gradual Duration:  1 week Timing:  Constant Progression:  Worsening Chronicity:  Recurrent Relieved by:  Nothing Worsened by:  Nothing   Past Medical History:  Diagnosis Date  . Diverticulitis   . Hypertension   . Pneumonia    hx of  . Seasonal allergies   . Tourette's syndrome     There are no active problems to display for this patient.   Past Surgical History:  Procedure Laterality Date  . ANKLE ARTHROSCOPY Left 12/19/2016  . LAPAROSCOPIC PARTIAL COLECTOMY N/A 08/01/2013   Procedure: LAPAROSCOPIC sigmoidectomy with splenic flexure moblization.;  Surgeon: Leighton Ruff, MD;  Location: [facility];  Service: General;  Laterality: N/A;  . TONSILLECTOMY    . WISDOM TOOTH EXTRACTION          Home Medications    Prior to Admission medications   Medication Sig Start Date End Date Taking? Authorizing Provider  bacitracin-polymyxin b (POLYSPORIN)  ophthalmic ointment Place 1 application into the right eye every 12 (twelve) hours. apply to eye every 12 hours while awake 01/22/16   Dowless, Aldona Bar Tripp, PA-C  clindamycin (CLEOCIN) 300 MG capsule Take 1 capsule (300 mg total) by mouth 3 (three) times daily. 01/10/17   Ashley Murrain, NP  erythromycin ophthalmic ointment Place a 1/2 inch ribbon of ointment into the lower eyelid. 01/22/16   Dowless, Aldona Bar Tripp, PA-C  hydrochlorothiazide (HYDRODIURIL) 25 MG tablet Take 25 mg by mouth every morning.     [provider]    Family History No family history on file.  Social History Social History   Tobacco Use  . Smoking status: Former Smoker    Packs/day: 1.00    Years: 15.00    Pack years: 15.00    Types: Cigarettes    Quit date: 05/17/2013    Years since quitting: 6.3  . Smokeless tobacco: Never Used  Substance Use Topics  . Alcohol use: No  . Drug use: No     Allergies   Orap [pimozide] and Mushroom extract complex   Review of Systems Review of Systems  Gastrointestinal: Positive for abdominal pain.  All other systems reviewed and are negative.    Physical Exam Updated Vital Signs BP 132/76 (BP Location: Right Arm)   Pulse (!) 119   Temp (!) 101.2 F (38.4 C) (Oral)   Resp 20   SpO2 98%   Physical Exam Vitals signs and nursing note reviewed.  Constitutional:      General: He is not in acute distress.    Appearance: He is well-developed.  HENT:     Head: Normocephalic and atraumatic.  Eyes:     Conjunctiva/sclera: Conjunctivae normal.     Pupils: Pupils are equal, round, and reactive to light.  Neck:     Musculoskeletal: Normal range of motion and neck supple.  Cardiovascular:     Rate and Rhythm: Normal rate and regular rhythm.     Heart sounds: Normal heart sounds.  Pulmonary:     Effort: Pulmonary effort is normal. No respiratory distress.     Breath sounds: Normal breath sounds.  Abdominal:     General: There is no distension.      Palpations: Abdomen is soft.     Tenderness: There is abdominal tenderness in the left lower quadrant.  Musculoskeletal: Normal range of motion.        General: No deformity.  Skin:    General: Skin is warm and dry.  Neurological:     Mental Status: He is alert and oriented to person, place, and time.      ED Treatments / Results  Labs (all labs ordered are listed, but only abnormal results are displayed) Labs Reviewed  COMPREHENSIVE METABOLIC PANEL - Abnormal; Notable for the following components:      Result Value   Sodium 134 (*)    Chloride 97 (*)    Glucose, Bld 110 (*)    Creatinine, Ser 1.30 (*)    Calcium 8.8 (*)    Albumin 3.4 (*)    Total Bilirubin 1.3 (*)    All other components within normal limits  CBC WITH DIFFERENTIAL/PLATELET - Abnormal; Notable for the following components:   WBC 24.0 (*)    Neutro Abs 17.5 (*)    Monocytes Absolute 3.1 (*)    Abs Immature Granulocytes 0.17 (*)    All other components within normal limits  PROTIME-INR - Abnormal; Notable for the following components:   Prothrombin Time 18.2 (*)    INR 1.5 (*)    All other components within normal limits  CULTURE, BLOOD (ROUTINE X 2)  CULTURE, BLOOD (ROUTINE X 2)  SARS CORONAVIRUS 2 (TAT 6-24 HRS)  LACTIC ACID, PLASMA  LACTIC ACID, PLASMA  URINALYSIS, ROUTINE W REFLEX MICROSCOPIC  PATHOLOGIST SMEAR REVIEW    EKG None  Radiology Dg Chest 2 View  Result Date: 09/18/2019 CLINICAL DATA:  Sepsis EXAM: CHEST - 2 VIEW COMPARISON:  None. FINDINGS: The heart size and mediastinal contours are within normal limits. Both lungs are clear. The visualized skeletal structures are unremarkable. IMPRESSION: No acute abnormality of the lungs. Electronically Signed   By: Lauralyn PrimesAlex  Bibbey M.D.   On: 09/18/2019 17:24   Ct Abdomen Pelvis W Contrast  Result Date: 09/18/2019 CLINICAL DATA:  Left abdominal pain since yesterday. History of diverticulitis. EXAM: CT ABDOMEN AND PELVIS WITH CONTRAST TECHNIQUE:  Multidetector CT imaging of the abdomen and pelvis was performed using the standard protocol following bolus administration of intravenous contrast. CONTRAST:  100mL OMNIPAQUE IOHEXOL 300 MG/ML  SOLN COMPARISON:  04/01/2013 CT abdomen/pelvis. FINDINGS: Lower chest: No significant pulmonary nodules or acute consolidative airspace disease. Hepatobiliary: Diffuse hepatic steatosis. No definite liver surface irregularity. No liver masses. Normal gallbladder with no radiopaque cholelithiasis. No biliary ductal dilatation. Pancreas: Normal, with no mass or duct dilation. Spleen: Normal size. No mass. Adrenals/Urinary Tract: Normal adrenals. Punctate nonobstructing upper right renal stone. Otherwise normal kidneys, with no hydronephrosis. No contour deforming  renal masses. Normal bladder. Stomach/Bowel: Normal non-distended stomach. Normal caliber small bowel with no small bowel wall thickening. Normal appendix. Evidence of partial distal colectomy with intact appearing distal colonic anastomosis. There is diverticulosis in the remnant distal large-bowel with prominent segmental wall thickening in the distal colon proximal to the anastomosis with associated prominent pericolonic fat stranding, compatible with acute diverticulitis. There is scattered free air surrounding the inflamed segment of colon compatible with perforation. No abscess. Vascular/Lymphatic: Normal caliber abdominal aorta. Patent portal, splenic, hepatic and renal veins. No pathologically enlarged lymph nodes in the abdomen or pelvis. Reproductive: Normal size prostate. Other: No ascites. Small to moderate fat containing umbilical hernia. Musculoskeletal: No aggressive appearing focal osseous lesions. Mild thoracolumbar spondylosis. IMPRESSION: 1. Acute perforated colonic diverticulitis in the left lower quadrant involving the distal colon proximal to the prior colectomy site. Pericolonic free air without abscess. 2. Diffuse hepatic steatosis. 3. Punctate  nonobstructing right renal stone. Electronically Signed   By: Delbert Phenix M.D.   On: 09/18/2019 17:59    Procedures Procedures (including critical care time) CRITICAL CARE Performed by: Wynetta Fines   Total critical care time: 30  minutes  Critical care time was exclusive of separately billable procedures and treating other patients.  Critical care was necessary to treat or prevent imminent or life-threatening deterioration.  Critical care was time spent personally by me on the following activities: development of treatment plan with patient and/or surrogate as well as nursing, discussions with consultants, evaluation of patient's response to treatment, examination of patient, obtaining history from patient or surrogate, ordering and performing treatments and interventions, ordering and review of laboratory studies, ordering and review of radiographic studies, pulse oximetry and re-evaluation of patient's condition.   Medications Ordered in ED Medications  ciprofloxacin (CIPRO) IVPB 400 mg (400 mg Intravenous New Bag/Given 09/18/19 1746)  metroNIDAZOLE (FLAGYL) IVPB 500 mg (500 mg Intravenous New Bag/Given 09/18/19 1746)  acetaminophen (TYLENOL) tablet 650 mg (650 mg Oral Given 09/18/19 1624)  sodium chloride 0.9 % bolus 1,000 mL (1,000 mLs Intravenous New Bag/Given 09/18/19 1747)  morphine 4 MG/ML injection 4 mg (4 mg Intravenous Given 09/18/19 1743)  iohexol (OMNIPAQUE) 300 MG/ML solution 100 mL (100 mLs Intravenous Contrast Given 09/18/19 1724)     Initial Impression / Assessment and Plan / ED Course  I have reviewed the triage vital signs and the nursing notes.  Pertinent labs & imaging results that were available during my care of the patient were reviewed by me and considered in my medical decision making (see chart for details).        MDM  Screen complete  Christopher Williamson was evaluated in Emergency Department on 09/18/2019 for the symptoms described in the history of  present illness. He was evaluated in the context of the global COVID-19 pandemic, which necessitated consideration that the patient might be at risk for infection with the SARS-CoV-2 virus that causes COVID-19. Institutional protocols and algorithms that pertain to the evaluation of patients at risk for COVID-19 are in a state of rapid change based on information released by regulatory bodies including the CDC and federal and state organizations. These policies and algorithms were followed during the patient's care in the ED.  Patient is presenting for evaluation of left lower quadrant abdominal pain.  Patient's presentation is consistent with likely diverticulitis.  Patient CT imaging reveals diverticulitis with perforation.  General surgery Janee Morn) is aware of case and will evaluate for admission.  Patient feels somewhat improved following his initial ED  work-up and understands plan of care.  Final Clinical Impressions(s) / ED Diagnoses   Final diagnoses:  Diverticulitis    ED Discharge Orders    None       Wynetta Fines, MD 09/18/19 [phone]

## 2019-09-18 NOTE — H&P (Signed)
Christopher Williamson is an 38 y.o. male.   Chief Complaint: Left lower quadrant abdominal pain HPI: 38 year old male known to our practice status post laparoscopic sigmoid colectomy for diverticulitis by Dr. Maisie Fushomas in 2014.  He developed left lower quadrant abdominal pain this past Tuesday.  It persisted and he went to an urgent care on Friday.  He was prescribed Cipro and Flagyl by mouth as well as tramadol.  He return for recheck today and he had a fever and elevated white blood cell count.  He was directed to the emergency department.  He underwent evaluation here including CT scan of the abdomen and pelvis.  This showed sigmoid diverticulitis with microperforation above the area of his previous resection.  There is no abscess.  I was asked to see him for surgical management.  The pain has lessened since he received some pain medicine in the emergency department.  For the past couple days he has been eating a light diet, mostly toast and bananas.  Past Medical History:  Diagnosis Date  . Diverticulitis   . Hypertension   . Pneumonia    hx of  . Seasonal allergies   . Tourette's syndrome     Past Surgical History:  Procedure Laterality Date  . ANKLE ARTHROSCOPY Left 12/19/2016  . LAPAROSCOPIC PARTIAL COLECTOMY N/A 08/01/2013   Procedure: LAPAROSCOPIC sigmoidectomy with splenic flexure moblization.;  Surgeon: Romie LeveeAlicia Thomas, MD;  Location: [facility];  Service: General;  Laterality: N/A;  . TONSILLECTOMY    . WISDOM TOOTH EXTRACTION      No family history on file. Social History:  reports that he quit smoking about 6 years ago. His smoking use included cigarettes. He has a 15.00 pack-year smoking history. He has never used smokeless tobacco. He reports that he does not drink alcohol or use drugs.  Allergies:  Allergies  Allergen Reactions  . Orap [Pimozide] Anaphylaxis  . Mushroom Extract Complex Other (See Comments)    Mushrooms cause gi upset    (Not in a hospital admission)   Results for  orders placed or performed during the hospital encounter of 09/18/19 (from the past 48 hour(s))  Comprehensive metabolic panel     Status: Abnormal   Collection Time: 09/18/19  4:19 PM  Result Value Ref Range   Sodium 134 (L) 135 - 145 mmol/L   Potassium 4.0 3.5 - 5.1 mmol/L   Chloride 97 (L) 98 - 111 mmol/L   CO2 25 22 - 32 mmol/L   Glucose, Bld 110 (H) 70 - 99 mg/dL   BUN 12 6 - 20 mg/dL   Creatinine, Ser 4.541.30 (H) 0.61 - 1.24 mg/dL   Calcium 8.8 (L) 8.9 - 10.3 mg/dL   Total Protein 7.1 6.5 - 8.1 g/dL   Albumin 3.4 (L) 3.5 - 5.0 g/dL   AST 21 15 - 41 U/L   ALT 30 0 - 44 U/L   Alkaline Phosphatase 39 38 - 126 U/L   Total Bilirubin 1.3 (H) 0.3 - 1.2 mg/dL   GFR calc non Af Amer >60 >60 mL/min   GFR calc Af Amer >60 >60 mL/min   Anion gap 12 5 - 15    Comment: Performed at [facility], [address] [place], [place] [id number]  Lactic acid, plasma     Status: None   Collection Time: 09/18/19  4:19 PM  Result Value Ref Range   Lactic Acid, Venous 1.0 0.5 - 1.9 mmol/L    Comment: Performed at [facility],  [address], [place], [place] [address]  CBC with Differential     Status: Abnormal   Collection Time: 09/18/19  4:19 PM  Result Value Ref Range   WBC 24.0 (H) 4.0 - 10.5 K/uL   RBC 5.25 4.22 - 5.81 MIL/uL   Hemoglobin 15.5 13.0 - 17.0 g/dL   HCT 66.2 94.7 - 65.4 %   MCV 89.1 80.0 - 100.0 fL   MCH 29.5 26.0 - 34.0 pg   MCHC 33.1 30.0 - 36.0 g/dL   RDW 65.0 35.4 - 65.6 %   Platelets 273 150 - 400 K/uL   nRBC 0.0 0.0 - 0.2 %   Neutrophils Relative % 73 %   Neutro Abs 17.5 (H) 1.7 - 7.7 K/uL   Lymphocytes Relative 13 %   Lymphs Abs 3.2 0.7 - 4.0 K/uL   Monocytes Relative 13 %   Monocytes Absolute 3.1 (H) 0.1 - 1.0 K/uL   Eosinophils Relative 0 %   Eosinophils Absolute 0.0 0.0 - 0.5 K/uL   Basophils Relative 0 %   Basophils Absolute 0.1 0.0 - 0.1 K/uL   RBC Morphology MORPHOLOGY UNREMARKABLE    Immature Granulocytes 1 %   Abs Immature Granulocytes 0.17  (H) 0.00 - 0.07 K/uL    Comment: Performed at [facility], [address] [place], [place] [address]  Protime-INR     Status: Abnormal   Collection Time: 09/18/19  4:19 PM  Result Value Ref Range   Prothrombin Time 18.2 (H) 11.4 - 15.2 seconds   INR 1.5 (H) 0.8 - 1.2    Comment: (NOTE) INR goal varies based on device and disease states. Performed at [facility], [address], [place], [place] [address]   Culture, blood (Routine x 2)     Status: None (Preliminary result)   Collection Time: 09/18/19  4:32 PM   Specimen: BLOOD  Result Value Ref Range   Specimen Description BLOOD LEFT ANTECUBITAL    Special Requests      BOTTLES DRAWN AEROBIC ONLY Blood Culture results may not be optimal due to an inadequate volume of blood received in culture bottles   Culture      NO GROWTH < 12 HOURS Performed at [facility], [address], [place], [place] [address]    Report Status PENDING   Culture, blood (Routine x 2)     Status: None (Preliminary result)   Collection Time: 09/18/19  4:58 PM   Specimen: BLOOD RIGHT HAND  Result Value Ref Range   Specimen Description BLOOD RIGHT HAND    Special Requests      BOTTLES DRAWN AEROBIC AND ANAEROBIC Blood Culture adequate volume   Culture      NO GROWTH <12 HOURS Performed at [facility], [address], [place], [place] [address]    Report Status PENDING   Urinalysis, Routine w reflex microscopic     Status: Abnormal   Collection Time: 09/18/19  6:51 PM  Result Value Ref Range   Color, Urine YELLOW YELLOW   APPearance CLEAR CLEAR   Specific Gravity, Urine >1.046 (H) 1.005 - 1.030   pH 6.0 5.0 - 8.0   Glucose, UA NEGATIVE NEGATIVE mg/dL   Hgb urine dipstick NEGATIVE NEGATIVE   Bilirubin Urine NEGATIVE NEGATIVE   Ketones, ur NEGATIVE NEGATIVE mg/dL   Protein, ur NEGATIVE NEGATIVE mg/dL   Nitrite NEGATIVE NEGATIVE   Leukocytes,Ua TRACE (A) NEGATIVE   RBC / HPF 0-5 0 - 5 RBC/hpf  WBC, UA 0-5 0 - 5 WBC/hpf    Bacteria, UA NONE SEEN NONE SEEN   Squamous Epithelial / LPF 0-5 0 - 5    Comment: Performed at [facility], [address] [place], [place] [address]   Dg Chest 2 View  Result Date: 09/18/2019 CLINICAL DATA:  Sepsis EXAM: CHEST - 2 VIEW COMPARISON:  None. FINDINGS: The heart size and mediastinal contours are within normal limits. Both lungs are clear. The visualized skeletal structures are unremarkable. IMPRESSION: No acute abnormality of the lungs. Electronically Signed   By: Lauralyn Primes M.D.   On: 09/18/2019 17:24   Ct Abdomen Pelvis W Contrast  Result Date: 09/18/2019 CLINICAL DATA:  Left abdominal pain since yesterday. History of diverticulitis. EXAM: CT ABDOMEN AND PELVIS WITH CONTRAST TECHNIQUE: Multidetector CT imaging of the abdomen and pelvis was performed using the standard protocol following bolus administration of intravenous contrast. CONTRAST:  OMNIPAQUE IOHEXOL 300 MG/ML  SOLN COMPARISON:  04/01/2013 CT abdomen/pelvis. FINDINGS: Lower chest: No significant pulmonary nodules or acute consolidative airspace disease. Hepatobiliary: Diffuse hepatic steatosis. No definite liver surface irregularity. No liver masses. Normal gallbladder with no radiopaque cholelithiasis. No biliary ductal dilatation. Pancreas: Normal, with no mass or duct dilation. Spleen: Normal size. No mass. Adrenals/Urinary Tract: Normal adrenals. Punctate nonobstructing upper right renal stone. Otherwise normal kidneys, with no hydronephrosis. No contour deforming renal masses. Normal bladder. Stomach/Bowel: Normal non-distended stomach. Normal caliber small bowel with no small bowel wall thickening. Normal appendix. Evidence of partial distal colectomy with intact appearing distal colonic anastomosis. There is diverticulosis in the remnant distal large-bowel with prominent segmental wall thickening in the distal colon proximal to the anastomosis with associated prominent pericolonic fat stranding,  compatible with acute diverticulitis. There is scattered free air surrounding the inflamed segment of colon compatible with perforation. No abscess. Vascular/Lymphatic: Normal caliber abdominal aorta. Patent portal, splenic, hepatic and renal veins. No pathologically enlarged lymph nodes in the abdomen or pelvis. Reproductive: Normal size prostate. Other: No ascites. Small to moderate fat containing umbilical hernia. Musculoskeletal: No aggressive appearing focal osseous lesions. Mild thoracolumbar spondylosis. IMPRESSION: 1. Acute perforated colonic diverticulitis in the left lower quadrant involving the distal colon proximal to the prior colectomy site. Pericolonic free air without abscess. 2. Diffuse hepatic steatosis. 3. Punctate nonobstructing right renal stone. Electronically Signed   By: Delbert Phenix M.D.   On: 09/18/2019 17:59    Review of Systems  Constitutional: Positive for fever.  HENT: Negative.   Eyes: Negative.   Respiratory: Negative for cough and shortness of breath.   Cardiovascular: Negative for chest pain.  Gastrointestinal: Positive for abdominal pain. Negative for blood in stool, nausea and vomiting.  Genitourinary: Negative.   Musculoskeletal: Negative.   Skin: Negative.   Neurological: Negative.   Endo/Heme/Allergies: Negative.   Psychiatric/Behavioral: Negative.     Blood pressure 132/76, pulse (!) 119, temperature (!) 101.2 F (38.4 C), temperature source Oral, resp. rate 20, SpO2 98 %. Physical Exam  Constitutional: He is oriented to person, place, and time. He appears well-developed and well-nourished. No distress.  HENT:  Head: Normocephalic.  Right Ear: External ear normal.  Left Ear: External ear normal.  Mouth/Throat: Oropharynx is clear and moist.  Eyes: Pupils are equal, round, and reactive to light. EOM are normal.  Neck: Neck supple. No tracheal deviation present. No thyromegaly present.  Cardiovascular: Normal rate, regular rhythm and normal heart  sounds.  Respiratory: Effort normal and breath sounds normal. No respiratory distress. He has  no wheezes. He has no rales.  GI: Soft. He exhibits no distension. There is abdominal tenderness. There is no rebound and no guarding.  Tender low left lower quadrant without guarding, no peritonitis, bowel sounds normal  Musculoskeletal: Normal range of motion.        General: No edema.  Neurological: He is alert and oriented to person, place, and time.  Skin: Skin is warm.  Psychiatric: He has a normal mood and affect.     Assessment/Plan Sigmoid diverticulitis with microperforation, no abscess - admit, bowel rest, IV Zosyn.  We will follow closely.  His wife is going home to get his CPAP mask.  Hypertension - home meds  Zenovia Jarred, MD 09/18/2019, 7:13 PM

## 2019-09-19 LAB — BASIC METABOLIC PANEL
Anion gap: 11 (ref 5–15)
BUN: 10 mg/dL (ref 6–20)
CO2: 26 mmol/L (ref 22–32)
Calcium: 8.8 mg/dL — ABNORMAL LOW (ref 8.9–10.3)
Chloride: 98 mmol/L (ref 98–111)
Creatinine, Ser: 1.28 mg/dL — ABNORMAL HIGH (ref 0.61–1.24)
GFR calc Af Amer: >60 mL/min (ref >60)
GFR calc non Af Amer: >60 mL/min (ref >60)
Glucose, Bld: 88 mg/dL (ref 70–99)
Potassium: 4.4 mmol/L (ref 3.5–5.1)
Sodium: 135 mmol/L (ref 135–145)

## 2019-09-19 LAB — CBC
HCT: 43.4 % (ref 39.0–52.0)
Hemoglobin: 14 g/dL (ref 13.0–17.0)
MCH: 29.1 pg (ref 26.0–34.0)
MCHC: 32.3 g/dL (ref 30.0–36.0)
MCV: 90.2 fL (ref 80.0–100.0)
Platelets: 248 10*3/uL (ref 150–400)
RBC: 4.81 MIL/uL (ref 4.22–5.81)
RDW: 13.5 % (ref 11.5–15.5)
WBC: 19.4 10*3/uL — ABNORMAL HIGH (ref 4.0–10.5)
nRBC: 0 % (ref 0.0–0.2)

## 2019-09-19 LAB — SARS CORONAVIRUS 2 (TAT 6-24 HRS): SARS Coronavirus 2: NEGATIVE

## 2019-09-19 LAB — PATHOLOGIST SMEAR REVIEW

## 2019-09-19 MED ORDER — INFLUENZA VAC SPLIT QUAD 0.5 ML IM SUSY: 0.5000 mL | PREFILLED_SYRINGE | INTRAMUSCULAR | Status: DC

## 2019-09-19 MED ORDER — INFLUENZA VAC SPLIT QUAD 0.5 ML IM SUSY: 0.5000 mL | PREFILLED_SYRINGE | INTRAMUSCULAR | Status: DC | PRN

## 2019-09-19 NOTE — Progress Notes (Signed)
Subjective/Chief Complaint: Pt states he feels well, and his abdominal pain has significantly improved. Pt states he has not had a BM, but has passed flatus and is urinating regurally. Pt denies nausea and vomiting. Pt is on NPO and is tolerating, states he does not feel hungry. Pt denies chest pain and SOB. He has been able to ambulate.    Objective: Vital signs in last 24 hours: Temp:  [97.7 F (36.5 C)-101.2 F (38.4 C)] 98.6 F (37 C) (10/05 0440) Pulse Rate:  [95-119] 98 (10/05 0440) Resp:  [20] 20 (10/04 2015) BP: (118-134)/(59-81) 118/59 (10/05 0440) SpO2:  [95 %-98 %] 96 % (10/05 0440) Weight:  [158.8 kg] 158.8 kg (10/04 2147) Last BM Date: 09/18/19  Intake/Output from previous day: 10/04 0701 - 10/05 0700 In: 2060.9 [I.V.:710.9; IV Piggyback:1350] Out: 2 [Urine:2] Intake/Output this shift: No intake/output data recorded.  General: pleasant man in no acute distress CV: Regular rate and rhythm  Pulm: Clear to auscultation  Abdominal: Abdomen is distended, with normoactive bowel sounds throughout. He is non tender to palpation in all quadrants, except he is mildly TTP in the LLQ.  Extremities: Pt able to wiggle toes, sensation intact. No edema   Lab Results:  Recent Labs    09/18/19 1947 09/19/19 0320  WBC 22.3* 19.4*  HGB 14.3 14.0  HCT 43.4 43.4  PLT 247 248   BMET Recent Labs    09/18/19 1619 09/18/19 1947 09/19/19 0320  NA 134*  --  135  K 4.0  --  4.4  CL 97*  --  98  CO2 25  --  26  GLUCOSE 110*  --  88  BUN 12  --  10  CREATININE 1.30* 1.14 1.28*  CALCIUM 8.8*  --  8.8*   PT/INR Recent Labs    09/18/19 1619  LABPROT 18.2*  INR 1.5*   ABG No results for input(s): PHART, HCO3 in the last 72 hours.  Invalid input(s): PCO2, PO2  Studies/Results: Dg Chest 2 View  Result Date: 09/18/2019 CLINICAL DATA:  Sepsis EXAM: CHEST - 2 VIEW COMPARISON:  None. FINDINGS: The heart size and mediastinal contours are within normal limits. Both lungs  are clear. The visualized skeletal structures are unremarkable. IMPRESSION: No acute abnormality of the lungs. Electronically Signed   By: Lauralyn Primes M.D.   On: 09/18/2019 17:24   Ct Abdomen Pelvis W Contrast  Result Date: 09/18/2019 CLINICAL DATA:  Left abdominal pain since yesterday. History of diverticulitis. EXAM: CT ABDOMEN AND PELVIS WITH CONTRAST TECHNIQUE: Multidetector CT imaging of the abdomen and pelvis was performed using the standard protocol following bolus administration of intravenous contrast. CONTRAST:  OMNIPAQUE IOHEXOL 300 MG/ML  SOLN COMPARISON:  04/01/2013 CT abdomen/pelvis. FINDINGS: Lower chest: No significant pulmonary nodules or acute consolidative airspace disease. Hepatobiliary: Diffuse hepatic steatosis. No definite liver surface irregularity. No liver masses. Normal gallbladder with no radiopaque cholelithiasis. No biliary ductal dilatation. Pancreas: Normal, with no mass or duct dilation. Spleen: Normal size. No mass. Adrenals/Urinary Tract: Normal adrenals. Punctate nonobstructing upper right renal stone. Otherwise normal kidneys, with no hydronephrosis. No contour deforming renal masses. Normal bladder. Stomach/Bowel: Normal non-distended stomach. Normal caliber small bowel with no small bowel wall thickening. Normal appendix. Evidence of partial distal colectomy with intact appearing distal colonic anastomosis. There is diverticulosis in the remnant distal large-bowel with prominent segmental wall thickening in the distal colon proximal to the anastomosis with associated prominent pericolonic fat stranding, compatible with acute diverticulitis. There is scattered free  air surrounding the inflamed segment of colon compatible with perforation. No abscess. Vascular/Lymphatic: Normal caliber abdominal aorta. Patent portal, splenic, hepatic and renal veins. No pathologically enlarged lymph nodes in the abdomen or pelvis. Reproductive: Normal size prostate. Other: No ascites.  Small to moderate fat containing umbilical hernia. Musculoskeletal: No aggressive appearing focal osseous lesions. Mild thoracolumbar spondylosis. IMPRESSION: 1. Acute perforated colonic diverticulitis in the left lower quadrant involving the distal colon proximal to the prior colectomy site. Pericolonic free air without abscess. 2. Diffuse hepatic steatosis. 3. Punctate nonobstructing right renal stone. Electronically Signed   By: Ilona Sorrel M.D.   On: 09/18/2019 17:59    Anti-infectives: Anti-infectives (From admission, onward)   Start     Dose/Rate Route Frequency Ordered Stop   09/18/19 2200  piperacillin-tazobactam (ZOSYN) IVPB 3.375 g     3.375 g 12.5 mL/hr over 240 Minutes Intravenous Every 8 hours 09/18/19 1931     09/18/19 1645  ciprofloxacin (CIPRO) IVPB 400 mg     400 mg 200 mL/hr over 60 Minutes Intravenous  Once 09/18/19 1643 09/18/19 1857   09/18/19 1645  metroNIDAZOLE (FLAGYL) IVPB 500 mg     500 mg 100 mL/hr over 60 Minutes Intravenous  Once 09/18/19 1643 09/18/19 1857      Assessment/Plan: Diverticulitis with laparoscopic partial colectomy in 2014  Recurrant Diverticulitis  Plan to treat conservatively, will follow and determine is surgery is necessary.  ID: Zosyn 10/4>>  WBC 19.4, afebrile, not tachy, AM labs  FEN: NPO and IV fluids, sips with meds ok  DVT: lovenox HTN- home meds   LOS: 1 day    Renato Gails Zaydin Billey 09/19/2019

## 2019-09-19 NOTE — Progress Notes (Signed)
On arrival patient is already on his NIV QHS (home unit) no assistance needed per patient.

## 2019-09-20 LAB — BASIC METABOLIC PANEL
Anion gap: 10 (ref 5–15)
BUN: 11 mg/dL (ref 6–20)
CO2: 25 mmol/L (ref 22–32)
Calcium: 8.4 mg/dL — ABNORMAL LOW (ref 8.9–10.3)
Chloride: 99 mmol/L (ref 98–111)
Creatinine, Ser: 1.2 mg/dL (ref 0.61–1.24)
GFR calc Af Amer: >60 mL/min (ref >60)
GFR calc non Af Amer: >60 mL/min (ref >60)
Glucose, Bld: 67 mg/dL — ABNORMAL LOW (ref 70–99)
Potassium: 4.1 mmol/L (ref 3.5–5.1)
Sodium: 134 mmol/L — ABNORMAL LOW (ref 135–145)

## 2019-09-20 LAB — CBC
HCT: 41.2 % (ref 39.0–52.0)
Hemoglobin: 13.6 g/dL (ref 13.0–17.0)
MCH: 30.1 pg (ref 26.0–34.0)
MCHC: 33 g/dL (ref 30.0–36.0)
MCV: 91.2 fL (ref 80.0–100.0)
Platelets: 253 10*3/uL (ref 150–400)
RBC: 4.52 MIL/uL (ref 4.22–5.81)
RDW: 13.3 % (ref 11.5–15.5)
WBC: 14.6 10*3/uL — ABNORMAL HIGH (ref 4.0–10.5)
nRBC: 0 % (ref 0.0–0.2)

## 2019-09-20 MED ORDER — ENOXAPARIN SODIUM 80 MG/0.8ML ~~LOC~~ SOLN: 80.0000 mg | SUBCUTANEOUS | Status: DC

## 2019-09-20 NOTE — Progress Notes (Signed)
Subjective/Chief Complaint: Pt has no complaints, and states he has no abdominal pain, nausea, or vomiting. He passed flatus and had a BM yesterday and today with no complications. He has been able to ambulate without any pain as well. Pt is urinating regularly. He is still NPO and states he is hungry.   Objective: Vital signs in last 24 hours: Temp:  [98.6 F (37 C)-99.5 F (37.5 C)] 98.6 F (37 C) (10/06 0554) Pulse Rate:  [81-89] 82 (10/06 0554) Resp:  [16-24] 17 (10/06 0554) BP: (129-132)/(70-78) 130/73 (10/06 0554) SpO2:  [94 %-97 %] 97 % (10/06 0554) Last BM Date: 09/19/19  Intake/Output from previous day: 10/05 0701 - 10/06 0700 In: 2294.1 [I.V.:2294.1] Out: -  Intake/Output this shift: No intake/output data recorded.  General: no acute distress CV: Regular rate and rhythm  Pulm: Clear to auscultation  Abdominal: Abdomen is distended, with normoactive bowel sounds throughout. He is non tender to palpation in all quadrants. Extremities: Pt able to wiggle toes, sensation intact. No edema   Lab Results:  Recent Labs    09/19/19 0320 09/20/19 0253  WBC 19.4* 14.6*  HGB 14.0 13.6  HCT 43.4 41.2  PLT 248 253   BMET Recent Labs    09/19/19 0320 09/20/19 0253  NA 135 134*  K 4.4 4.1  CL 98 99  CO2 26 25  GLUCOSE 88 67*  BUN 10 11  CREATININE 1.28* 1.20  CALCIUM 8.8* 8.4*   PT/INR Recent Labs    09/18/19 1619  LABPROT 18.2*  INR 1.5*   ABG No results for input(s): PHART, HCO3 in the last 72 hours.  Invalid input(s): PCO2, PO2  Studies/Results: Dg Chest 2 View  Result Date: 09/18/2019 CLINICAL DATA:  Sepsis EXAM: CHEST - 2 VIEW COMPARISON:  None. FINDINGS: The heart size and mediastinal contours are within normal limits. Both lungs are clear. The visualized skeletal structures are unremarkable. IMPRESSION: No acute abnormality of the lungs. Electronically Signed   By: Eddie Candle M.D.   On: 09/18/2019 17:24   Ct Abdomen Pelvis W  Contrast  Result Date: 09/18/2019 CLINICAL DATA:  Left abdominal pain since yesterday. History of diverticulitis. EXAM: CT ABDOMEN AND PELVIS WITH CONTRAST TECHNIQUE: Multidetector CT imaging of the abdomen and pelvis was performed using the standard protocol following bolus administration of intravenous contrast. CONTRAST:  167mL OMNIPAQUE IOHEXOL 300 MG/ML  SOLN COMPARISON:  04/01/2013 CT abdomen/pelvis. FINDINGS: Lower chest: No significant pulmonary nodules or acute consolidative airspace disease. Hepatobiliary: Diffuse hepatic steatosis. No definite liver surface irregularity. No liver masses. Normal gallbladder with no radiopaque cholelithiasis. No biliary ductal dilatation. Pancreas: Normal, with no mass or duct dilation. Spleen: Normal size. No mass. Adrenals/Urinary Tract: Normal adrenals. Punctate nonobstructing upper right renal stone. Otherwise normal kidneys, with no hydronephrosis. No contour deforming renal masses. Normal bladder. Stomach/Bowel: Normal non-distended stomach. Normal caliber small bowel with no small bowel wall thickening. Normal appendix. Evidence of partial distal colectomy with intact appearing distal colonic anastomosis. There is diverticulosis in the remnant distal large-bowel with prominent segmental wall thickening in the distal colon proximal to the anastomosis with associated prominent pericolonic fat stranding, compatible with acute diverticulitis. There is scattered free air surrounding the inflamed segment of colon compatible with perforation. No abscess. Vascular/Lymphatic: Normal caliber abdominal aorta. Patent portal, splenic, hepatic and renal veins. No pathologically enlarged lymph nodes in the abdomen or pelvis. Reproductive: Normal size prostate. Other: No ascites. Small to moderate fat containing umbilical hernia. Musculoskeletal: No aggressive appearing focal osseous  lesions. Mild thoracolumbar spondylosis. IMPRESSION: 1. Acute perforated colonic diverticulitis in  the left lower quadrant involving the distal colon proximal to the prior colectomy site. Pericolonic free air without abscess. 2. Diffuse hepatic steatosis. 3. Punctate nonobstructing right renal stone. Electronically Signed   By: Delbert Phenix M.D.   On: 09/18/2019 17:59    Anti-infectives: Anti-infectives (From admission, onward)   Start     Dose/Rate Route Frequency Ordered Stop   09/18/19 2200  piperacillin-tazobactam (ZOSYN) IVPB 3.375 g     3.375 g 12.5 mL/hr over 240 Minutes Intravenous Every 8 hours 09/18/19 1931     09/18/19 1645  ciprofloxacin (CIPRO) IVPB 400 mg     400 mg 200 mL/hr over 60 Minutes Intravenous  Once 09/18/19 1643 09/18/19 1857   09/18/19 1645  metroNIDAZOLE (FLAGYL) IVPB 500 mg     500 mg 100 mL/hr over 60 Minutes Intravenous  Once 09/18/19 1643 09/18/19 1857      Assessment/Plan: Diverticulitis with laparoscopic partial colectomy in 2014  Recurrant Diverticulitis  Coninue to treat conservatively, advance to full liquids as tolerated.   ID: Zosyn 10/5>>  WBC 14.6, afebrile, not tachy, continue to monitor WBC  FEN: NPO and IV fluids, sips with meds ok  DVT: lovenox HTN- home meds   LOS: 2 days    Christopher Williamson Christopher Williamson 09/20/2019

## 2019-09-21 ENCOUNTER — Encounter (HOSPITAL_COMMUNITY): Payer: Self-pay | Admitting: *Deleted

## 2019-09-21 LAB — CBC
HCT: 44.9 % (ref 39.0–52.0)
Hemoglobin: 14.6 g/dL (ref 13.0–17.0)
MCH: 29.4 pg (ref 26.0–34.0)
MCHC: 32.5 g/dL (ref 30.0–36.0)
MCV: 90.3 fL (ref 80.0–100.0)
Platelets: 362 10*3/uL (ref 150–400)
RBC: 4.97 MIL/uL (ref 4.22–5.81)
RDW: 13.3 % (ref 11.5–15.5)
WBC: 8.3 10*3/uL (ref 4.0–10.5)
nRBC: 0 % (ref 0.0–0.2)

## 2019-09-21 MED ORDER — OXYCODONE HCL 5 MG PO TABS: 5.0000 mg | ORAL_TABLET | Freq: Four times a day (QID) | ORAL | 0 refills | Status: AC | PRN

## 2019-09-21 MED ORDER — ACETAMINOPHEN 325 MG PO TABS: 650.0000 mg | ORAL_TABLET | Freq: Four times a day (QID) | ORAL | Status: AC | PRN

## 2019-09-21 MED ORDER — PANTOPRAZOLE SODIUM 40 MG PO TBEC: 40.0000 mg | DELAYED_RELEASE_TABLET | Freq: Every day | ORAL | Status: DC

## 2019-09-21 MED ORDER — AMOXICILLIN-POT CLAVULANATE 875-125 MG PO TABS: 1.0000 | ORAL_TABLET | Freq: Two times a day (BID) | ORAL | 0 refills | Status: AC

## 2019-09-21 MED ORDER — AMOXICILLIN-POT CLAVULANATE 875-125 MG PO TABS: 1.0000 | ORAL_TABLET | Freq: Two times a day (BID) | ORAL | Status: DC

## 2019-09-21 NOTE — Discharge Summary (Signed)
Physician Discharge Summary  Patient ID: Christopher Williamson MRN: [id number] DOB/AGE: 1981-10-23 38 y.o.  Admit date: 09/18/2019 Discharge date: 09/21/2019  Admission Diagnoses:  Sigmoid diverticulitis with microperforation, no abscess  Tobacco abuse Diverticulitis with lap assisted partial colectomy 2014 Dr. Maisie Fus HTN -home meds OSA, CPAP at night  Discharge Diagnoses:  Sigmoid diverticulitis with microperforation, no abscess  Tobacco abuse Diverticulitis with lap assisted partial colectomy 2014 Dr. Maisie Fus HTN -home meds OSA, CPAP at night BMI 50  Active Problems:   Diverticulitis large intestine PCP:  Darrow Bussing, MD   PROCEDURES: None  Hospital Course:  Chief Complaint: Left lower quadrant abdominal pain HPI: 38 year old male known to our practice status post laparoscopic sigmoid colectomy for diverticulitis by Dr. Maisie Fus in 2014.  He developed left lower quadrant abdominal pain this past Tuesday.  It persisted and he went to an urgent care on Friday.  He was prescribed Cipro and Flagyl by mouth as well as tramadol.  He return for recheck today and he had a fever and elevated white blood cell count.  He was directed to the emergency department.Temp 101.2 on admit to ED.  He underwent evaluation here including CT scan of the abdomen and pelvis.  This showed sigmoid diverticulitis with microperforation above the area of his previous resection.  There is no abscess.  Dr. Janee Morn was asked to see him for surgical management.  The pain has lessened since he received some pain medicine in the emergency department.  For the past couple days he has been eating a light diet, mostly toast and bananas.  Pt was seen in the ED and CT revealed sigmoid diverticulitis with microperforation, no abscess.    He was admitted by DR. Janee Morn, and placed on medical management, and Zosyn.  He has made good progress and no further fever.  WBC was 24.0K on admit and has returned to normal.  He has had his  diet advanced and is ready for DC on 09/21/19. We will send him home on 7 more days of Augmentin.  He will follow up with Dr. Maisie Fus as noted below.  We also told him to follow up with his PCP.  Condition on DC:  Improved  CBC Latest Ref Rng & Units 09/21/2019 09/20/2019 09/19/2019  WBC 4.0 - 10.5 K/uL 8.3 14.6(H) 19.4(H)  Hemoglobin 13.0 - 17.0 g/dL 24.2 35.3 61.4  Hematocrit 39.0 - 52.0 % 44.9 41.2 43.4  Platelets 150 - 400 K/uL 362 253 248   CMP Latest Ref Rng & Units 09/20/2019 09/19/2019 09/18/2019  Glucose 70 - 99 mg/dL 43(X) 88 -  BUN 6 - 20 mg/dL 11 10 -  Creatinine 5.40 - 1.24 mg/dL 0.86 7.61(P) 5.09  Sodium 135 - 145 mmol/L 134(L) 135 -  Potassium 3.5 - 5.1 mmol/L 4.1 4.4 -  Chloride 98 - 111 mmol/L 99 98 -  CO2 22 - 32 mmol/L 25 26 -  Calcium 8.9 - 10.3 mg/dL 3.2(I) 7.1(I) -  Total Protein 6.5 - 8.1 g/dL - - -  Total Bilirubin 0.3 - 1.2 mg/dL - - -  Alkaline Phos 38 - 126 U/L - - -  AST 15 - 41 U/L - - -  ALT 0 - 44 U/L - - -    Disposition: Discharge disposition: 01-Home or Self Care       Allergies as of 09/21/2019      Reactions   Orap [pimozide] Anaphylaxis   Mushroom Extract Complex Other (See Comments)   Mushrooms cause gi upset  Medication List    STOP taking these medications   ciprofloxacin 500 MG tablet Commonly known as: CIPRO   metroNIDAZOLE 500 MG tablet Commonly known as: FLAGYL   traMADol 50 MG tablet Commonly known as: ULTRAM     TAKE these medications   acetaminophen 325 MG tablet Commonly known as: TYLENOL Take 2 tablets (650 mg total) by mouth every 6 (six) hours as needed for mild pain, moderate pain, fever or headache (or temp > 100).   amLODipine 5 MG tablet Commonly known as: NORVASC Take 5 mg by mouth daily.   amoxicillin-clavulanate 875-125 MG tablet Commonly known as: AUGMENTIN Take 1 tablet by mouth every 12 (twelve) hours.   lisinopril 10 MG tablet Commonly known as: ZESTRIL Take 10 mg by mouth daily.   oxyCODONE  5 MG immediate release tablet Commonly known as: Oxy IR/ROXICODONE Take 1 tablet (5 mg total) by mouth every 6 (six) hours as needed for severe pain (Call if you start needing this pain medicine.).      Follow-up Information    Leighton Ruff, MD Follow up on 10/03/2019.   Specialty: General Surgery Why: 320Your appointment is at 3:20 PM, be at the office 30 minutes early for check in.  Bring photo ID and insurance information.   Contact information: [address] [place] [address] [phone]           Signed: Earnstine Regal 09/21/2019, 2:29 PM

## 2019-09-21 NOTE — Progress Notes (Signed)
[facility]/Trauma Progress Note      Assessment/Plan Tobacco abuse Diverticulitis with lap assisted partial colectomy 2014 Dr. Marcello Moores HTN -home meds OSA, CPAP at night  Recurrent diverticulitis with microperforation - Improving on conservative management, no pain, WBC improving, afebrile  FEN: FLD VTE: SCD's, lovenox ID: Zosyn10/5>>WBC 14.6, afebrile, not tachy, continue to monitor WBC  Follow up: Dr. Marcello Moores  DISPO: Advance to [place] today.  Likely home this afternoon if tolerates    LOS: 3 days    Subjective: CC: No complaints  Patient denies abdominal pain.  No issues overnight.  No nausea, vomiting, fever or chills.  Objective: Vital signs in last 24 hours: Temp:  [98.4 F (36.9 C)-98.8 F (37.1 C)] 98.4 F (36.9 C) (10/07 0405) Pulse Rate:  [73-80] 73 (10/07 0405) Resp:  [18-19] 18 (10/07 0405) BP: (126-128)/(69-75) 128/69 (10/07 0405) SpO2:  [96 %-99 %] 97 % (10/07 0405) Last BM Date: 09/20/19  Intake/Output from previous day: 10/06 0701 - 10/07 0700 In: 2743.3 [P.O.:1280; I.V.:1463.3] Out: -  Intake/Output this shift: No intake/output data recorded.  PE: Gen:  Alert, NAD, pleasant, cooperative Card:  RRR, no M/G/R heard Pulm:  CTA, no W/R/R, effort normal Abd: Soft, obese, NT/ND, +BS Skin: no rashes noted, warm and dry   Anti-infectives: Anti-infectives (From admission, onward)   Start     Dose/Rate Route Frequency Ordered Stop   09/18/19 2200  piperacillin-tazobactam (ZOSYN) IVPB 3.375 g     3.375 g 12.5 mL/hr over 240 Minutes Intravenous Every 8 hours 09/18/19 1931     09/18/19 1645  ciprofloxacin (CIPRO) IVPB 400 mg     400 mg 200 mL/hr over 60 Minutes Intravenous  Once 09/18/19 1643 09/18/19 1857   09/18/19 1645  metroNIDAZOLE (FLAGYL) IVPB 500 mg     500 mg 100 mL/hr over 60 Minutes Intravenous  Once 09/18/19 1643 09/18/19 1857      Lab Results:  Recent Labs    09/19/19 0320 09/20/19 0253  WBC 19.4* 14.6*  HGB 14.0  13.6  HCT 43.4 41.2  PLT 248 253   BMET Recent Labs    09/19/19 0320 09/20/19 0253  NA 135 134*  K 4.4 4.1  CL 98 99  CO2 26 25  GLUCOSE 88 67*  BUN 10 11  CREATININE 1.28* 1.20  CALCIUM 8.8* 8.4*   PT/INR Recent Labs    09/18/19 1619  LABPROT 18.2*  INR 1.5*   CMP     Component Value Date/Time   NA 134 (L) 09/20/2019 0253   K 4.1 09/20/2019 0253   CL 99 09/20/2019 0253   CO2 25 09/20/2019 0253   GLUCOSE 67 (L) 09/20/2019 0253   BUN 11 09/20/2019 0253   CREATININE 1.20 09/20/2019 0253   CALCIUM 8.4 (L) 09/20/2019 0253   PROT 7.1 09/18/2019 1619   ALBUMIN 3.4 (L) 09/18/2019 1619   AST 21 09/18/2019 1619   ALT 30 09/18/2019 1619   ALKPHOS 39 09/18/2019 1619   BILITOT 1.3 (H) 09/18/2019 1619   GFRNONAA >60 09/20/2019 0253   GFRAA >60 09/20/2019 0253   Lipase     Component Value Date/Time   LIPASE 31 01/17/2013 1322    Studies/Results: No results found.   Kalman Drape, [facility] Pager [phone] Cristine Polio, & Friday 7:00am - 4:30pm Thursdays 7:00am -11:30am  Consults: [phone]

## 2019-09-21 NOTE — Discharge Instructions (Signed)

## 2019-09-23 LAB — CULTURE, BLOOD (ROUTINE X 2)
Culture: NO GROWTH
Culture: NO GROWTH
Special Requests: ADEQUATE

## 2019-10-03 DIAGNOSIS — K5732 Diverticulitis of large intestine without perforation or abscess without bleeding: Secondary | ICD-10-CM | POA: Diagnosis not present

## 2020-02-15 DIAGNOSIS — I10 Essential (primary) hypertension: Secondary | ICD-10-CM | POA: Diagnosis not present

## 2020-03-01 DIAGNOSIS — Z13220 Encounter for screening for lipoid disorders: Secondary | ICD-10-CM | POA: Diagnosis not present

## 2020-03-01 DIAGNOSIS — I10 Essential (primary) hypertension: Secondary | ICD-10-CM | POA: Diagnosis not present
